# Patient Record
Sex: Female | Born: 1965 | Race: White | Hispanic: No | Marital: Married | Smoking: Never smoker
Health system: Southern US, Community
[De-identification: ages and names within clinical notes are randomized; demographics above are authoritative.]

## PROBLEM LIST (undated history)

## (undated) DIAGNOSIS — Z973 Presence of spectacles and contact lenses: Secondary | ICD-10-CM

## (undated) DIAGNOSIS — F32A Depression, unspecified: Secondary | ICD-10-CM

## (undated) DIAGNOSIS — F329 Major depressive disorder, single episode, unspecified: Secondary | ICD-10-CM

## (undated) DIAGNOSIS — E039 Hypothyroidism, unspecified: Secondary | ICD-10-CM

## (undated) HISTORY — PX: CHOLECYSTECTOMY: SHX55

## (undated) HISTORY — PX: GALLBLADDER SURGERY: SHX652

## (undated) HISTORY — PX: WISDOM TOOTH EXTRACTION: SHX21

---

## 1997-11-30 ENCOUNTER — Other Ambulatory Visit: Admission: RE | Admit: 1997-11-30 | Discharge: 1997-11-30 | Payer: Self-pay | Admitting: Obstetrics and Gynecology

## 1998-04-05 ENCOUNTER — Ambulatory Visit (HOSPITAL_COMMUNITY): Admission: RE | Admit: 1998-04-05 | Discharge: 1998-04-05 | Payer: Self-pay | Admitting: Obstetrics and Gynecology

## 1998-04-22 ENCOUNTER — Encounter: Admission: RE | Admit: 1998-04-22 | Discharge: 1998-07-21 | Payer: Self-pay | Admitting: Obstetrics and Gynecology

## 1998-06-28 ENCOUNTER — Inpatient Hospital Stay (HOSPITAL_COMMUNITY): Admission: AD | Admit: 1998-06-28 | Discharge: 1998-07-01 | Payer: Self-pay | Admitting: *Deleted

## 1998-07-02 ENCOUNTER — Encounter (HOSPITAL_COMMUNITY): Admission: RE | Admit: 1998-07-02 | Discharge: 1998-09-30 | Payer: Self-pay | Admitting: Obstetrics and Gynecology

## 1998-07-18 ENCOUNTER — Encounter: Payer: Self-pay | Admitting: Emergency Medicine

## 1998-07-18 ENCOUNTER — Inpatient Hospital Stay (HOSPITAL_COMMUNITY): Admission: EM | Admit: 1998-07-18 | Discharge: 1998-07-20 | Payer: Self-pay | Admitting: Emergency Medicine

## 1998-07-18 ENCOUNTER — Encounter: Payer: Self-pay | Admitting: Surgery

## 1998-10-04 ENCOUNTER — Encounter (HOSPITAL_COMMUNITY): Admission: RE | Admit: 1998-10-04 | Discharge: 1999-01-02 | Payer: Self-pay | Admitting: Obstetrics and Gynecology

## 2001-01-01 ENCOUNTER — Other Ambulatory Visit: Admission: RE | Admit: 2001-01-01 | Discharge: 2001-01-01 | Payer: Self-pay | Admitting: Obstetrics and Gynecology

## 2001-03-19 ENCOUNTER — Inpatient Hospital Stay (HOSPITAL_COMMUNITY): Admission: AD | Admit: 2001-03-19 | Discharge: 2001-03-19 | Payer: Self-pay | Admitting: Obstetrics and Gynecology

## 2001-03-22 ENCOUNTER — Inpatient Hospital Stay (HOSPITAL_COMMUNITY): Admission: AD | Admit: 2001-03-22 | Discharge: 2001-03-22 | Payer: Self-pay | Admitting: Obstetrics and Gynecology

## 2001-07-31 ENCOUNTER — Encounter (INDEPENDENT_AMBULATORY_CARE_PROVIDER_SITE_OTHER): Payer: Self-pay

## 2001-07-31 ENCOUNTER — Inpatient Hospital Stay (HOSPITAL_COMMUNITY): Admission: AD | Admit: 2001-07-31 | Discharge: 2001-08-03 | Payer: Self-pay | Admitting: *Deleted

## 2001-08-12 ENCOUNTER — Encounter: Admission: RE | Admit: 2001-08-12 | Discharge: 2001-09-11 | Payer: Self-pay | Admitting: Obstetrics and Gynecology

## 2001-09-09 ENCOUNTER — Other Ambulatory Visit: Admission: RE | Admit: 2001-09-09 | Discharge: 2001-09-09 | Payer: Self-pay | Admitting: Obstetrics and Gynecology

## 2001-09-12 ENCOUNTER — Encounter: Admission: RE | Admit: 2001-09-12 | Discharge: 2001-10-12 | Payer: Self-pay | Admitting: Obstetrics and Gynecology

## 2001-11-12 ENCOUNTER — Encounter: Admission: RE | Admit: 2001-11-12 | Discharge: 2001-12-12 | Payer: Self-pay | Admitting: Obstetrics and Gynecology

## 2001-12-13 ENCOUNTER — Encounter: Admission: RE | Admit: 2001-12-13 | Discharge: 2002-01-12 | Payer: Self-pay | Admitting: Obstetrics and Gynecology

## 2002-02-12 ENCOUNTER — Encounter: Admission: RE | Admit: 2002-02-12 | Discharge: 2002-03-14 | Payer: Self-pay | Admitting: Obstetrics and Gynecology

## 2002-04-14 ENCOUNTER — Encounter: Admission: RE | Admit: 2002-04-14 | Discharge: 2002-05-14 | Payer: Self-pay | Admitting: Obstetrics and Gynecology

## 2002-05-15 ENCOUNTER — Encounter: Admission: RE | Admit: 2002-05-15 | Discharge: 2002-06-14 | Payer: Self-pay | Admitting: Obstetrics and Gynecology

## 2004-02-16 ENCOUNTER — Encounter: Admission: RE | Admit: 2004-02-16 | Discharge: 2004-02-16 | Payer: Self-pay | Admitting: Family Medicine

## 2004-03-03 ENCOUNTER — Ambulatory Visit (HOSPITAL_COMMUNITY): Admission: RE | Admit: 2004-03-03 | Discharge: 2004-03-03 | Payer: Self-pay | Admitting: Family Medicine

## 2004-11-21 ENCOUNTER — Encounter: Admission: RE | Admit: 2004-11-21 | Discharge: 2004-11-21 | Payer: Self-pay | Admitting: Family Medicine

## 2005-03-19 HISTORY — PX: BREAST EXCISIONAL BIOPSY: SUR124

## 2005-09-14 ENCOUNTER — Encounter: Admission: RE | Admit: 2005-09-14 | Discharge: 2005-09-14 | Payer: Self-pay | Admitting: Family Medicine

## 2005-10-11 ENCOUNTER — Encounter (INDEPENDENT_AMBULATORY_CARE_PROVIDER_SITE_OTHER): Payer: Self-pay | Admitting: Specialist

## 2005-10-11 ENCOUNTER — Ambulatory Visit (HOSPITAL_BASED_OUTPATIENT_CLINIC_OR_DEPARTMENT_OTHER): Admission: RE | Admit: 2005-10-11 | Discharge: 2005-10-11 | Payer: Self-pay | Admitting: Surgery

## 2005-12-24 ENCOUNTER — Encounter: Admission: RE | Admit: 2005-12-24 | Discharge: 2005-12-24 | Payer: Self-pay | Admitting: Family Medicine

## 2006-03-19 HISTORY — PX: ROUX-EN-Y PROCEDURE: SUR1287

## 2006-03-19 HISTORY — PX: UMBILICAL HERNIA REPAIR: SHX2598

## 2006-03-22 ENCOUNTER — Encounter: Admission: RE | Admit: 2006-03-22 | Discharge: 2006-06-20 | Payer: Self-pay | Admitting: *Deleted

## 2006-03-22 ENCOUNTER — Ambulatory Visit (HOSPITAL_COMMUNITY): Admission: RE | Admit: 2006-03-22 | Discharge: 2006-03-22 | Payer: Self-pay | Admitting: *Deleted

## 2006-07-02 ENCOUNTER — Inpatient Hospital Stay (HOSPITAL_COMMUNITY): Admission: RE | Admit: 2006-07-02 | Discharge: 2006-07-08 | Payer: Self-pay | Admitting: *Deleted

## 2006-07-03 ENCOUNTER — Encounter: Payer: Self-pay | Admitting: Vascular Surgery

## 2006-07-03 ENCOUNTER — Ambulatory Visit: Payer: Self-pay | Admitting: Vascular Surgery

## 2006-07-09 ENCOUNTER — Encounter: Admission: RE | Admit: 2006-07-09 | Discharge: 2006-10-07 | Payer: Self-pay | Admitting: *Deleted

## 2006-08-20 ENCOUNTER — Encounter: Admission: RE | Admit: 2006-08-20 | Discharge: 2006-08-20 | Payer: Self-pay | Admitting: Surgery

## 2006-09-17 ENCOUNTER — Encounter: Admission: RE | Admit: 2006-09-17 | Discharge: 2006-09-17 | Payer: Self-pay | Admitting: *Deleted

## 2007-01-01 ENCOUNTER — Encounter: Admission: RE | Admit: 2007-01-01 | Discharge: 2007-01-01 | Payer: Self-pay | Admitting: *Deleted

## 2008-08-25 ENCOUNTER — Encounter: Admission: RE | Admit: 2008-08-25 | Discharge: 2008-08-25 | Payer: Self-pay | Admitting: Family Medicine

## 2008-09-15 ENCOUNTER — Encounter: Admission: RE | Admit: 2008-09-15 | Discharge: 2008-09-15 | Payer: Self-pay | Admitting: Family Medicine

## 2009-04-14 IMAGING — CT CT ABDOMEN W/ CM
2 of 5 series · 17 of 46 positions shown, 19 images · IV contrast (omnipaque)
Comparison: None

ABDOMEN CT WITH CONTRAST

CLINICAL DATA: Status post gastric bypass on 07/02/2006. Nausea, vomiting
TECHNIQUE: Multidetector CT imaging of the abdomen and pelvis was performed
following the standard protocol during bolus administration of intravenous
contrast.

Contrast:  125 cc Omnipaque 300

[Series 2: abd_pel 5.0 b20f st · axial · 0.87mm/px · z∈[-506,-80]mm · 14 of 96 slices shown, 16 images]
[im 6/96  soft-tissue]
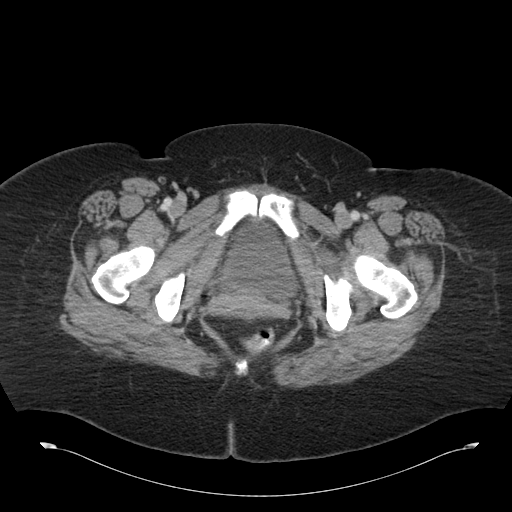
[im 6/96  bone]
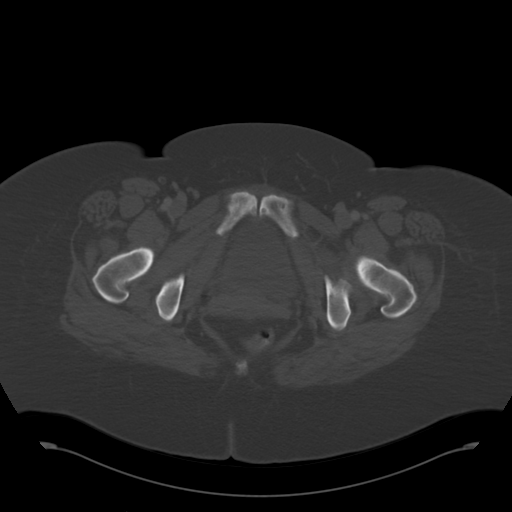
[im 11/96  soft-tissue]
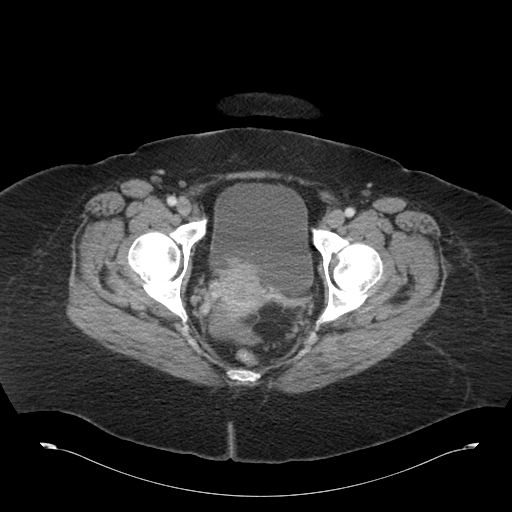
[im 21/96  soft-tissue]
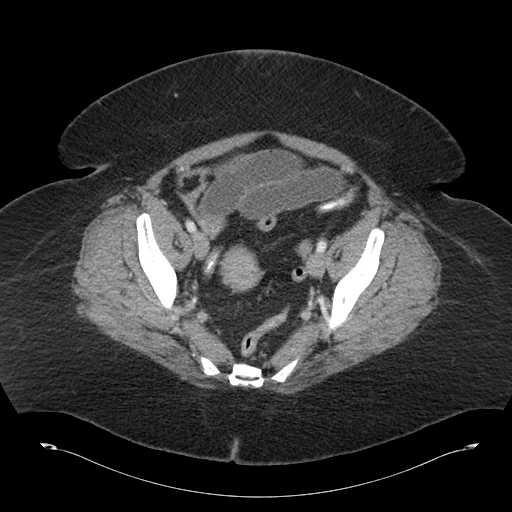
[im 26/96  soft-tissue]
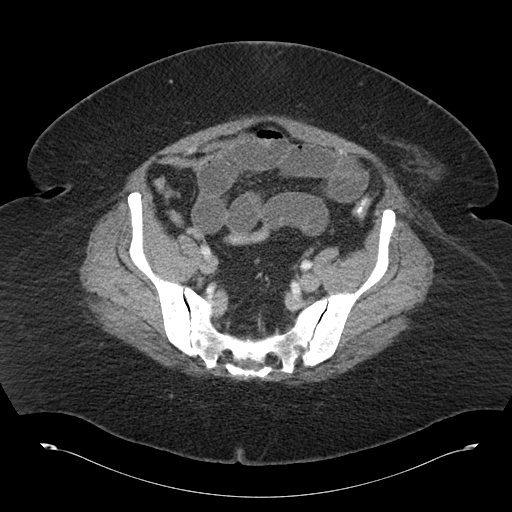
[im 31/96  soft-tissue]
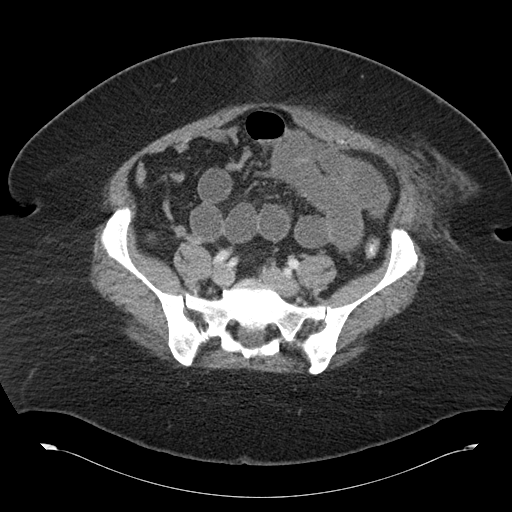
[im 41/96  soft-tissue]
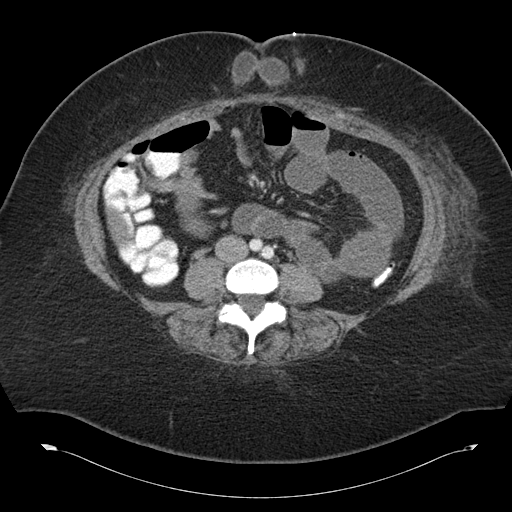
[im 46/96  soft-tissue]
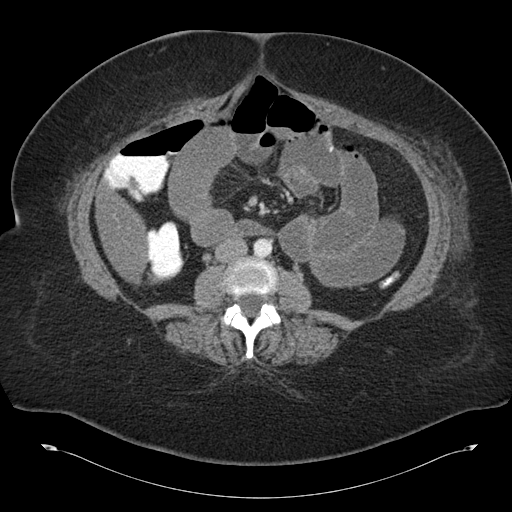
[im 51/96  soft-tissue]
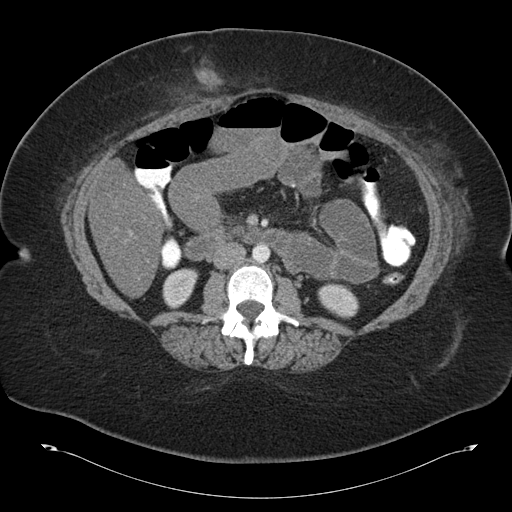
[im 56/96  soft-tissue]
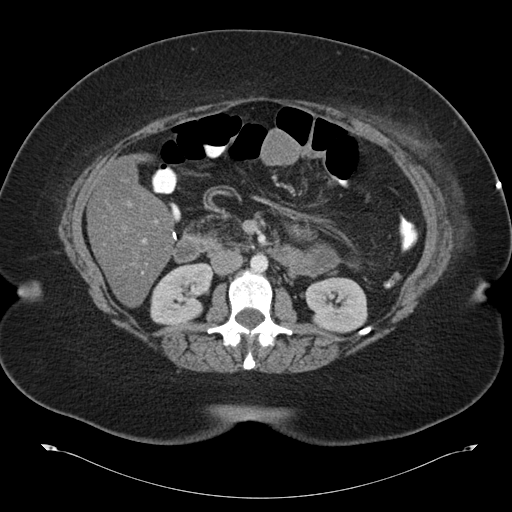
[im 56/96  bone]
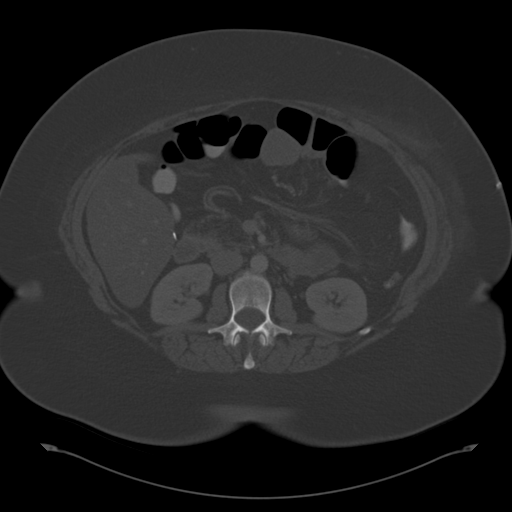
[im 66/96  soft-tissue]
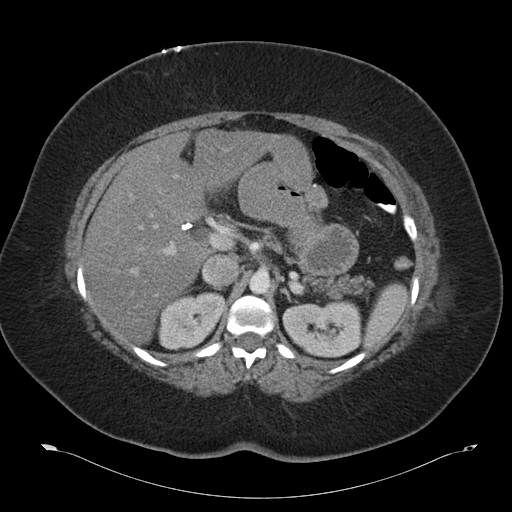
[im 71/96  soft-tissue]
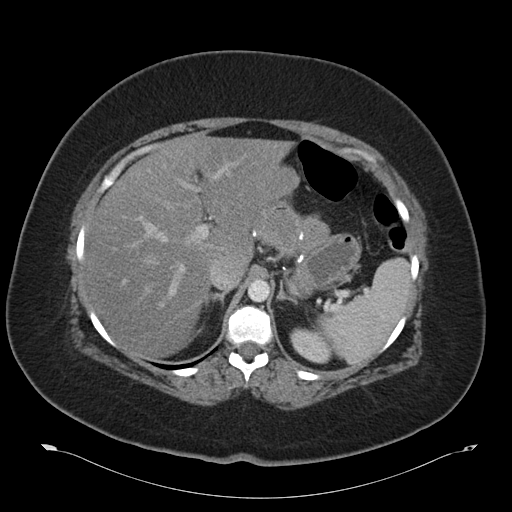
[im 76/96  soft-tissue]
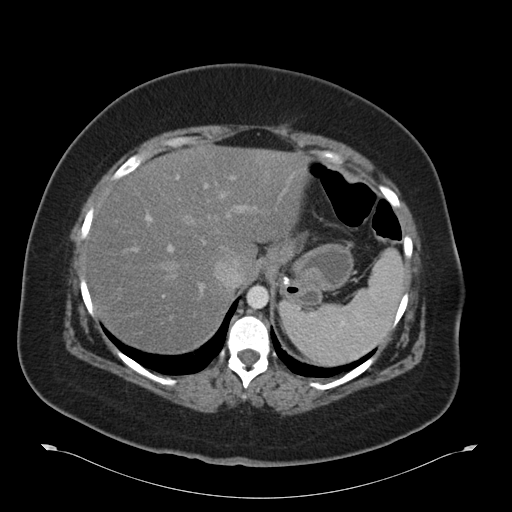
[im 86/96  soft-tissue]
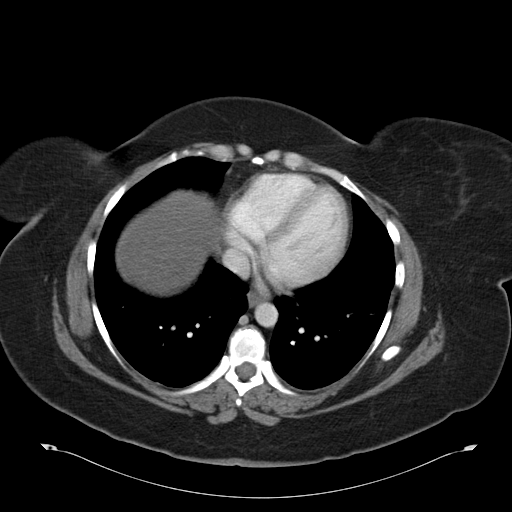
[im 91/96  soft-tissue]
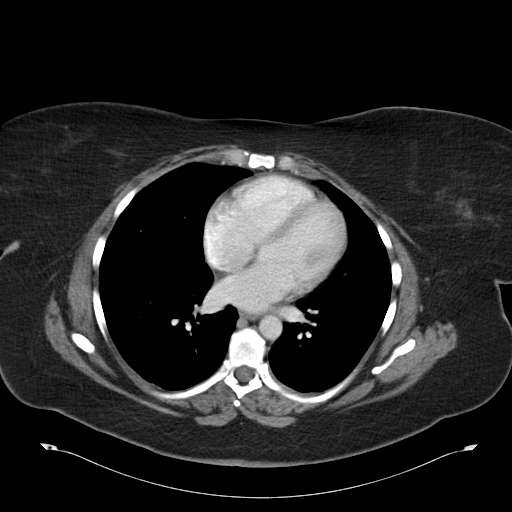

[Series 602: coronal abdomen · coronal · 0.98mm/px · 3 of 171 slices shown]
[im 57/171  soft-tissue]
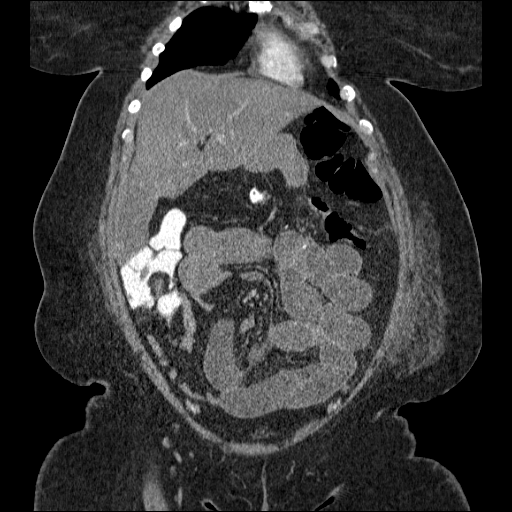
[im 76/171  soft-tissue]
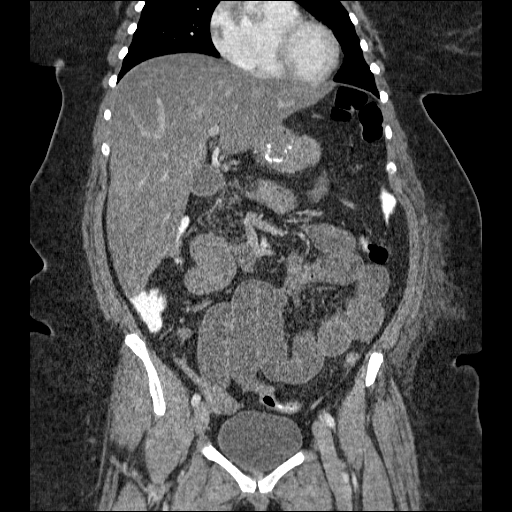
[im 95/171  soft-tissue]
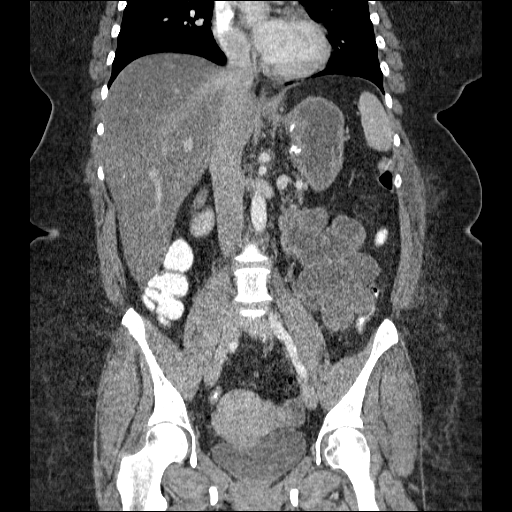

[17 of 46 positions shown; findings below may reference images not displayed]

FINDINGS: Patient status post gastric bypass surgery. There is an umbilical
hernia present which contains a loop of small bowel. Proximal to this, small
bowel is dilated. Distally, small bowel is decompressed. Findings are most
compatible with partial small bowel obstruction from an incarcerated umbilical
hernia. Contrast has passed into the colon.

There is fatty infiltration of the liver. The patient status post
cholecystectomy. Fatty replacement of the pancreas noted.

Adrenals, kidneys unremarkable.

There is linear atelectasis in the left lung base. No effusions.

IMPRESSION

Findings concerning for incarcerated umbilical hernia with partial small bowel
obstruction.

Status post gastric bypass.

Fatty liver.

PELVIS CT WITH CONTRAST
FINDINGS: Small bowel dilatation is noted proximally with distal decompression,
again compatible with partial small bowel obstruction from incarcerated
umbilical hernia. Uterus and adnexa unremarkable, with small cyst or follicle
within the left ovary. A small amount of free fluid is noted in the pelvis.

IMPRESSION

Findings compatible with partial small bowel obstruction as above. 

These results were called to Dr. Renzo Daniel at the time of interpretation.

## 2009-08-29 ENCOUNTER — Encounter: Admission: RE | Admit: 2009-08-29 | Discharge: 2009-08-29 | Payer: Self-pay | Admitting: Family Medicine

## 2010-04-10 ENCOUNTER — Encounter: Payer: Self-pay | Admitting: Family Medicine

## 2010-08-04 NOTE — H&P (Signed)
[facility]  Patient:    Amanda Koch, Amanda Koch Visit Number: [id number] MRN: [id number]          Service Type: OBS Location: 910B 9164 01 Attending Physician:  Donne Hazel Dictated by:   Willey Blade, M.D. Admit Date:  07/31/2001                           History and Physical  ADDENDUM TO REPORT #[id number]  PHYSICAL EXAMINATION:  VITAL SIGNS:                  Temperature afebrile.  Blood pressure 120/78, fetal heart tones 140s and reassuring.  GENERAL:                      She is a well-developed, well-nourished, overweight, and gravid female in no acute distress.  HEENT:                        Within normal limits.  NECK:                         Supple.  HEART:                        Regular rate and rhythm without murmur, gallop, or rub.  LUNGS:                        Clear to auscultation.  BREAST:                       Deferred.  ABDOMEN:                      Gravid and nontender.  EXTREMITIES AND NEUROLOGIC:   Grossly normal.  PELVIC:                       Normal external female genitalia noted.  Vagina clear but gravid.  Cervix is 2 cm, 40%, vertex, -2 station.  The uterus is gravid and nontender.  Adnexa is clear.  ADMITTING DIAGNOSIS:          1. Intrauterine pregnancy at term.                               2. Fetal macrosomia.                               3. Hypothyroidism.                               4. History of positive group B Streptococcus                                  vaginal colonization.  PLAN:                         1. Serial induction of labor.  2. Intravenous antibiotics for group B strep.                               3. Attempt vaginal delivery - discussed with                                  patient. Dictated by:   Willey Blade, M.D. Attending Physician:  Donne Hazel DD:  07/31/01 TD:  07/31/01 Job: [id number]

## 2010-08-04 NOTE — H&P (Signed)
[facility]  Patient:    Amanda Koch, Amanda Koch Visit Number: [id number] MRN: [id number]          Service Type: OBS Location: 910B 9164 01 Attending Physician:  Donne Hazel Dictated by:   Willey Blade, M.D. Admit Date:  07/31/2001                           History and Physical  HISTORY OF PRESENT ILLNESS:   Ms. Amanda Koch is a 45 year old female gravida 2 para 1 at 55 and four-sevenths weeks gestation.  The patient was admitted for induction of labor secondary to fetal macrosomia.  Her previous delivery, she had a baby weighing 8 pounds 6 ounces.  Her cervix is 2 cm dilated and ultrasound on Jul 30, 2001 shows a fetus weighing 9.8 pounds.  She was given the option of cesarean section versus induction of labor and has elected to undergo a trial of labor.  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  Her pregnancy otherwise went very well.  She underwent AFP extra testing which was normal.  OBSTETRICAL LABORATORY DATA:  Maternal blood type A positive.  Rubella immune. Group B strep positive.  Glucola elevated at 149; a subsequent three-hour glucose tolerance test was normal with values of 90, 177, 135, and 100.  MEDICAL HISTORY:              1. History of hypothyroidism.                               2. History of degenerative joint disease.                               3. History of anxiety.  SURGICAL HISTORY:             1. Normal spontaneous vaginal delivery x1 at                                  term.                               2. Wisdom teeth extraction.  OBSTETRICAL HISTORY:          In 2000, a normal spontaneous vaginal delivery at term, a female weighing 8 pounds 6 ounces.  CURRENT MEDICATIONS:          Synthroid and prenatal vitamins.  ALLERGIES:                    None known.  ADMITTING  DIAGNOSES:          1. Intrauterine pregnancy at term.                               2. Fetal macrosomia.                               3. History of positive group B Streptococcus  vaginal colonization.                               4. History of hypothyroidism.  PLAN:                         1. Admit.                               2. Serial induction of labor.                               3. Intravenous penicillin for positive group B                                  strep vaginal colonization.                               4. Attempt vaginal delivery - discussed with                                  patient. Dictated by:   Willey Blade, M.D. Attending Physician:  Donne Hazel DD:  07/31/01 TD:  07/31/01 Job: [id number]

## 2010-08-04 NOTE — Op Note (Signed)
Amanda Koch, Amanda Koch            ACCOUNT NO.:  [id number]   MEDICAL RECORD NO.:  [id number]          PATIENT TYPE:  INP   LOCATION:  1610                         FACILITY:  [facility]   PHYSICIAN:  Thornton Park. Daphine Deutscher, MD  DATE OF BIRTH:  10/27/1965   DATE OF PROCEDURE:  07/06/2006  DATE OF DISCHARGE:                               OPERATIVE REPORT   PREOPERATIVE DIAGNOSIS:  Incarcerated umbilical hernia.   POSTOPERATIVE DIAGNOSIS:  Incarcerated umbilical hernia.   PROCEDURE:  Takedown incarcerated umbilical hernia with primary closure  with Prolene suture.   SURGEON:  Thornton Park. Daphine Deutscher, MD.   ASSISTANT:  Alfonse Ras, MD.   ANESTHESIA:  General.   DESCRIPTION OF PROCEDURE:  [name] is on postop day #4  laparoscopic gastric bypass.  She has had some problems with persistent  nausea and vomiting.  A CT scan performed earlier in the day shows loops  of small bowel stuck up and an umbilical hernia.   The patient was informed and discussed in the holding area and a permit  was signed.  The patient was taken back room one and given general  anesthesia.  The abdomen was prepped with Techni-Care and draped  sterilely.  Small curvilinear incision was made and the umbilicus was  elevated off this incarcerated hernia that appeared to be about the size  of a golf ball.  I gently kind of dissected around this and went down  the neck.  I went superiorly where I could actually see a fascial defect  and inserted a Kelly and gently cut cephalad to create a big enough hole  to allow the bowel to be reduced easily.  I had cut into the sac and  looked at the bowel and it appeared to be a little bit congested but  there was no evidence of a small bowel infarction.  This was returned to  the abdomen.  Excess sac was cut away and then the fascial hole was  closed transversely with three sutures of 0 Prolene placed in a simple  fashion.  Once tied, the opening was obliterated in the fascia.   The  wound was then closed with 4-0 Vicryl subcutaneously with staples.  The  patient was taken to recovery room in satisfactory condition.      Thornton Park Daphine Deutscher, MD  Electronically Signed     MBM/MEDQ  D:  07/06/2006  T:  07/07/2006  Job:  [id number]

## 2010-08-04 NOTE — Op Note (Signed)
NAMELEANORA, MURIN            ACCOUNT NO.:  [id number]   MEDICAL RECORD NO.:  [id number]          PATIENT TYPE:  AMB   LOCATION:  NESC                         FACILITY:  [facility]   PHYSICIAN:  Wilmon Arms. Corliss Skains, M.D. DATE OF BIRTH:  [birth year]   DATE OF PROCEDURE:  10/11/2005  DATE OF DISCHARGE:                                 OPERATIVE REPORT   PREOPERATIVE DIAGNOSES:  Right nipple drainage.   POSTOPERATIVE DIAGNOSES:  Right nipple drainage.   PROCEDURES PERFORMED:  Right nipple duct excision.   SURGEON:  Wilmon Arms. Tsuei, M.D.   ANESTHESIA:  General via LMA.   INDICATIONS:  The patient is a 45 year old female, who presents with a two-  month history of persistent nipple drainage.  Several weeks ago, it was  bloody for several days.  She underwent a mammogram and ultrasound, which  were unremarkable.  A ductogram showed no evidence of filling defects.  The  patient was then referred for surgical evaluation.   DESCRIPTION OF PROCEDURE:  The patient was brought to the operating room and  placed in supine position on the operating room table.  After an adequate  level of general anesthesia was obtained, the patient's right breast was  prepped with Betadine and draped in sterile fashion.  Time-out was taken to  insure the patient and proper procedure.  I was able to express some clear  yellow discharge from the right nipple in the lateral half.  I was able to  cannulate this draining duct with a 00 lacrimal duct probe.  An incision was  made, after infiltrating with 0.25% Marcaine, approximately one inch lateral  to the nipple.  Dissection was carried into the subcutaneous tissues and  behind the nipple.  The duct in question was then amputated at the level of  the posterior surface of the nipple.  This area was grasped with an Allis  clamp.  I then took out a cone-shaped core of tissue, extending down from  the superficial apex.  The specimen measured approximately 2 inches in  height and 1.5 inch in diameter.  This was performed with electrocautery.  The specimen was passed off the field after being marked at the apex with a  2-0 silk suture.  The wound was then irrigated and hemostasis was obtained  with cautery.  The wound was closed with a deep layer of 3-0 Vicryl and a  subcuticular layer of 4-0 Monocryl.  Steri-Strips and clean dressings were  applied.  The patient was then extubated and brought to the recovery room in  stable condition.   Abdominal sponge, instrument and needle counts were correct.      Wilmon Arms. Tsuei, M.D.  Electronically Signed     MKT/MEDQ  D:  10/11/2005  T:  10/11/2005  Job:  [id number]

## 2010-08-04 NOTE — Op Note (Signed)
Amanda Koch, Amanda Koch            ACCOUNT NO.:  [id number]   MEDICAL RECORD NO.:  [id number]          PATIENT TYPE:  INP   LOCATION:  1610                         FACILITY:  [facility]   PHYSICIAN:  Alfonse Ras, MD   DATE OF BIRTH:  1965-10-21   DATE OF PROCEDURE:  07/02/2006  DATE OF DISCHARGE:                               OPERATIVE REPORT   PREOPERATIVE DIAGNOSIS:  Medically refractory morbid obesity.   POSTOPERATIVE DIAGNOSIS:  Medically refractory morbid obesity.   PROCEDURE:  Laparoscopic Roux-en-Y gastric bypass, anticolic,  antegastric, left facing Roux limb and upper endoscopy.   SURGEON:  Baruch Merl, M.D.   ASSISTANT:  Thornton Park. Daphine Deutscher, MD   ANESTHESIA:  General.   BLOOD LOSS:  200 mL.   DRAINS:  No drains left.   DESCRIPTION:  The patient was taken to the operating room, placed in  supine position.  After adequate anesthesia was induced using  endotracheal tube, the abdomen was prepped and draped in normal sterile  fashion.  Using a 12 mm OptiVu in the left upper quadrant direct  peritoneal access was obtained under direct vision.  Pneumoperitoneum  was obtained.  Two additional 12 mm trocars were placed in the right  upper quadrant, another one in the periumbilical region.  Small  umbilical hernia with omentum was reduced.  Additional 5 mm trocar was  placed in the left abdomen.   Ligament of Treitz was identified and the small bowel was transected  using a 45-mm white load GIA stapling device 40 cm distal to the  ligament.  The Roux limb was tagged with a Penrose drain and counting  100 cm distal, the jejunojejunostomy was performed in the standard  fashion.  Enterotomies were made with a harmonic scalpel 45-mm white  load GIA stapling device was applied.  The defect was closed with a  running 2-0 Vicryl suture.  The mesenteric defect was closed with a  running 2-0 silk suture.  The anastomosis was inspected and reinforced  using Tisseel.   Then the  patient was placed in steep head-up position.  Nathanson's  liver retractor was placed through a 5 mm incision in the subxiphoid  region.  Left lateral segment of the liver was retracted anteriorly.  The angle of his was sharply and bluntly dissected into the lesser sac.  An area approximately 5 cm distal to the EG junction was identified  along the lesser curve.  On entering the lesser sac.  There was a bit of  bleeding where I needed to apply some clips.  This was easily  controlled.  After the lesser sac had been entered.  A 60 mm gold load  GIA stapling device was then fired across the stomach.  It did appear to  have some misfiring at the proximal end.  The remainder of the pouch was  created using two a additional serial loads of the blue load GIA  stapling device to the angle of his.  Complete gastric separation was  ensured and the Ewald tube was placed down into the pouch.  After  placing the Ewald tube down to the pouch,  it appeared that there was  some mucosal rent on the right side of the pouch and therefore this part  of the pouch where the stapler was fired was just simply transected  using an additional GIA stapling device.  On reinspection this was  intact.   The Roux limb was then identified and brought up next to the pouch in  the left facing position.  Prior to doing this the remnant staple line  was oversewn with a running locking 2-0 silk suture.  Adequate  hemostasis was assured.  The very proximal part of the pouch was  reinforced with Tisseel.  After the Roux limb was placed next to the  pouch.  It was sewed posteriorly with running 2-0 Vicryl suture  approximating the pouch and the small bowel.  Enterotomy and gastrotomy  were made with a Harmonic scalpel and a satisfactory anastomosis was  created using a blue load GIA 45 mm stapling device.  Adequate  hemostasis was ensured.  The defect was closed with running 2-0 Vicryl  suture.  The Ewald tube was then placed  down through the anastomosis and  anterior 2-0 Vicryl on an SH was used to approximate the anterior  serosa.  Peterson's defect was then closed with a running 2-0 silk  suture.  Adequate hemostasis was assured.  The upper abdomen was then  copiously irrigated and upper endoscopy was performed by Dr. Daphine Deutscher  which will be dictated in a separate note, which showed no evidence of  leak.  The upper quadrants were aspirated.  Adequate hemostasis was  assured.  Pneumoperitoneum was released after Tisseel then applied to  the GJ anastomosis.  Trocars were removed.  There was some bleeding from  the right lower incision which was controlled with an interrupted 2-0  nylon suture.  The remainder of incisions were closed with staples.  Sterile dressings were applied.  The patient tolerated procedure well  with PACU in good condition.      Alfonse Ras, MD  Electronically Signed     KRE/MEDQ  D:  07/02/2006  T:  07/02/2006  Job:  [id number]

## 2010-08-04 NOTE — Discharge Summary (Signed)
[name], [name]            ACCOUNT NO.:  [id number]   MEDICAL RECORD NO.:  [id number]          PATIENT TYPE:  INP   LOCATION:  1610                         [facility]ACILITY:  [facility]   PHYSICIAN:  Alfonse Ras, MD   DATE OF BIRTH:  Jul 17, 1965   DATE OF ADMISSION:  07/02/2006  DATE OF DISCHARGE:  07/08/2006                               DISCHARGE SUMMARY   ADMISSION DIAGNOSIS:  Medically refractive morbid obesity.   DISCHARGE DIAGNOSIS:  Medically refractive morbid obesity, incarcerated  umbilical hernia in the postop period.   PROCEDURES:  Laparoscopic Roux-en-Y gastric bypass and open repair of  umbilical hernia toward a postop period.   CONDITION ON DISCHARGE:  Good and improved.   DISPOSITION:  Discharge to home.   MEDICATIONS:  Roxicet elixir for pain.  Synthroid and Zoloft as taken  preoperatively.   HOSPITAL COURSE:  The patient was admitted and underwent laparoscopic  Roux-en-Y gastric bypass.  At the time of surgery, the patient had an  incarcerated umbilical hernia with omentum through a small defect which  was reduced.  Gastric bypass went by uneventfully; however, over the  next 2-3 days the patient continued to have increasing nausea even after  a normal upper GI.  CT scan was obtained on postoperative day #4 which  showed an incarcerated umbilical hernia with small bowel incarcerated.  She was taken to the operating room to have reduction of this.  There  was no evidence of infarction of the bowel. This will be dictated  separately by Dr. Daphine Deutscher.  By postoperative day #1 and then #2, the  patient was tolerating a liquid diet, had no further nausea, incisions  looked fine and she was ready for discharge home.  I will follow up with  her in one week.      Alfonse Ras, MD  Electronically Signed     KRE/MEDQ  D:  07/08/2006  T:  07/08/2006  Job:  [id number]

## 2012-05-08 ENCOUNTER — Other Ambulatory Visit: Payer: Self-pay | Admitting: Orthopedic Surgery

## 2012-05-08 ENCOUNTER — Encounter (HOSPITAL_BASED_OUTPATIENT_CLINIC_OR_DEPARTMENT_OTHER): Payer: Self-pay | Admitting: *Deleted

## 2012-05-08 NOTE — Progress Notes (Signed)
No heart or resp problems-denies sleep apnea

## 2012-05-12 ENCOUNTER — Encounter (HOSPITAL_BASED_OUTPATIENT_CLINIC_OR_DEPARTMENT_OTHER): Payer: Self-pay | Admitting: Anesthesiology

## 2012-05-12 ENCOUNTER — Ambulatory Visit (HOSPITAL_BASED_OUTPATIENT_CLINIC_OR_DEPARTMENT_OTHER): Payer: 59 | Admitting: Anesthesiology

## 2012-05-12 ENCOUNTER — Ambulatory Visit (HOSPITAL_BASED_OUTPATIENT_CLINIC_OR_DEPARTMENT_OTHER)
Admission: RE | Admit: 2012-05-12 | Discharge: 2012-05-12 | Disposition: A | Discharge status: Home / Self Care | Payer: 59 | Source: Ambulatory Visit | Attending: Orthopedic Surgery | Admitting: Orthopedic Surgery

## 2012-05-12 ENCOUNTER — Encounter (HOSPITAL_BASED_OUTPATIENT_CLINIC_OR_DEPARTMENT_OTHER)
Admission: RE | Disposition: A | Discharge status: Home / Self Care | Payer: Self-pay | Source: Ambulatory Visit | Attending: Orthopedic Surgery

## 2012-05-12 DIAGNOSIS — E039 Hypothyroidism, unspecified: Secondary | ICD-10-CM | POA: Insufficient documentation

## 2012-05-12 DIAGNOSIS — F329 Major depressive disorder, single episode, unspecified: Secondary | ICD-10-CM | POA: Insufficient documentation

## 2012-05-12 DIAGNOSIS — F3289 Other specified depressive episodes: Secondary | ICD-10-CM | POA: Insufficient documentation

## 2012-05-12 DIAGNOSIS — Z79899 Other long term (current) drug therapy: Secondary | ICD-10-CM | POA: Insufficient documentation

## 2012-05-12 DIAGNOSIS — B079 Viral wart, unspecified: Secondary | ICD-10-CM | POA: Insufficient documentation

## 2012-05-12 HISTORY — DX: Depression, unspecified: F32.A

## 2012-05-12 HISTORY — DX: Major depressive disorder, single episode, unspecified: F32.9

## 2012-05-12 HISTORY — PX: I&D EXTREMITY: SHX5045

## 2012-05-12 HISTORY — DX: Hypothyroidism, unspecified: E03.9

## 2012-05-12 HISTORY — DX: Presence of spectacles and contact lenses: Z97.3

## 2012-05-12 SURGERY — IRRIGATION AND DEBRIDEMENT EXTREMITY
Anesthesia: Monitor Anesthesia Care | Site: Finger | Laterality: Left | Wound class: Clean

## 2012-05-12 MED ORDER — BUPIVACAINE HCL (PF) 0.25 % IJ SOLN
INTRAMUSCULAR | Status: DC | PRN
  Administered 2012-05-12: 10 mL

## 2012-05-12 MED ORDER — HYDROCODONE-ACETAMINOPHEN 5-325 MG PO TABS: ORAL_TABLET | ORAL | Status: DC

## 2012-05-12 MED ORDER — LIDOCAINE HCL (PF) 0.5 % IJ SOLN
INTRAMUSCULAR | Status: DC | PRN
  Administered 2012-05-12: 35 mL via INTRAVENOUS

## 2012-05-12 MED ORDER — MIDAZOLAM HCL 5 MG/5ML IJ SOLN
INTRAMUSCULAR | Status: DC | PRN
  Administered 2012-05-12: 2 mg via INTRAVENOUS

## 2012-05-12 MED ORDER — DEXAMETHASONE SODIUM PHOSPHATE 10 MG/ML IJ SOLN
INTRAMUSCULAR | Status: DC | PRN
  Administered 2012-05-12: 10 mg via INTRAVENOUS

## 2012-05-12 MED ORDER — KETOROLAC TROMETHAMINE 30 MG/ML IJ SOLN
INTRAMUSCULAR | Status: DC | PRN
  Administered 2012-05-12: 30 mg via INTRAVENOUS

## 2012-05-12 MED ORDER — LACTATED RINGERS IV SOLN
INTRAVENOUS | Status: DC
  Administered 2012-05-12: 11:00:00 via INTRAVENOUS

## 2012-05-12 MED ORDER — CHLORHEXIDINE GLUCONATE 4 % EX LIQD: 60.0000 mL | Freq: Once | CUTANEOUS | Status: DC

## 2012-05-12 MED ORDER — FENTANYL CITRATE 0.05 MG/ML IJ SOLN
50.0000 ug | INTRAMUSCULAR | Status: DC | PRN
Start: 2012-05-12 — End: 2012-05-12

## 2012-05-12 MED ORDER — LIDOCAINE HCL (CARDIAC) 20 MG/ML IV SOLN
INTRAVENOUS | Status: DC | PRN
  Administered 2012-05-12: 50 mg via INTRAVENOUS

## 2012-05-12 MED ORDER — MIDAZOLAM HCL 2 MG/2ML IJ SOLN: 1.0000 mg | INTRAMUSCULAR | Status: DC | PRN

## 2012-05-12 MED ORDER — PROPOFOL 10 MG/ML IV EMUL
INTRAVENOUS | Status: DC | PRN
  Administered 2012-05-12: 100 ug/kg/min via INTRAVENOUS

## 2012-05-12 MED ORDER — FENTANYL CITRATE 0.05 MG/ML IJ SOLN
INTRAMUSCULAR | Status: DC | PRN
  Administered 2012-05-12: 50 ug via INTRAVENOUS
  Administered 2012-05-12 (×2): 25 ug via INTRAVENOUS

## 2012-05-12 MED ORDER — CEFAZOLIN SODIUM-DEXTROSE 2-3 GM-% IV SOLR
2.0000 g | Freq: Once | INTRAVENOUS | Status: AC
  Administered 2012-05-12: 2 g via INTRAVENOUS

## 2012-05-12 SURGICAL SUPPLY — 48 items
BAG DECANTER FOR FLEXI CONT (MISCELLANEOUS) IMPLANT
BANDAGE ELASTIC 3 VELCRO ST LF (GAUZE/BANDAGES/DRESSINGS) IMPLANT
BANDAGE GAUZE ELAST BULKY 4 IN (GAUZE/BANDAGES/DRESSINGS) IMPLANT
BANDAGE GAUZE STRT 1 STR LF (GAUZE/BANDAGES/DRESSINGS) IMPLANT
BLADE MINI RND TIP GREEN BEAV (BLADE) IMPLANT
BLADE SURG 15 STRL LF DISP TIS (BLADE) ×2 IMPLANT
BLADE SURG 15 STRL SS (BLADE) ×2
BNDG COHESIVE 1X5 TAN STRL LF (GAUZE/BANDAGES/DRESSINGS) IMPLANT
BNDG ELASTIC 2 VLCR STRL LF (GAUZE/BANDAGES/DRESSINGS) IMPLANT
BNDG ESMARK 4X9 LF (GAUZE/BANDAGES/DRESSINGS) IMPLANT
CHLORAPREP W/TINT 26ML (MISCELLANEOUS) ×2 IMPLANT
CLOTH BEACON ORANGE TIMEOUT ST (SAFETY) ×2 IMPLANT
CORDS BIPOLAR (ELECTRODE) ×2 IMPLANT
COVER MAYO STAND STRL (DRAPES) ×2 IMPLANT
COVER TABLE BACK 60X90 (DRAPES) ×2 IMPLANT
CUFF TOURNIQUET SINGLE 18IN (TOURNIQUET CUFF) ×2 IMPLANT
DRAPE EXTREMITY T 121X128X90 (DRAPE) ×2 IMPLANT
DRAPE SURG 17X23 STRL (DRAPES) ×2 IMPLANT
GAUZE PACKING IODOFORM 1/4X5 (PACKING) IMPLANT
GAUZE XEROFORM 1X8 LF (GAUZE/BANDAGES/DRESSINGS) ×2 IMPLANT
GLOVE BIO SURGEON STRL SZ 6.5 (GLOVE) ×2 IMPLANT
GLOVE BIO SURGEON STRL SZ7.5 (GLOVE) ×2 IMPLANT
GLOVE BIOGEL PI IND STRL 8 (GLOVE) ×1 IMPLANT
GLOVE BIOGEL PI INDICATOR 8 (GLOVE) ×1
GOWN PREVENTION PLUS XLARGE (GOWN DISPOSABLE) ×4 IMPLANT
GOWN STRL REIN XL XLG (GOWN DISPOSABLE) ×2 IMPLANT
LOOP VESSEL MAXI BLUE (MISCELLANEOUS) IMPLANT
NEEDLE HYPO 25X1 1.5 SAFETY (NEEDLE) ×2 IMPLANT
NS IRRIG 1000ML POUR BTL (IV SOLUTION) ×2 IMPLANT
PACK BASIN DAY SURGERY FS (CUSTOM PROCEDURE TRAY) ×2 IMPLANT
PAD CAST 3X4 CTTN HI CHSV (CAST SUPPLIES) IMPLANT
PADDING CAST ABS 4INX4YD NS (CAST SUPPLIES) ×1
PADDING CAST ABS COTTON 4X4 ST (CAST SUPPLIES) ×1 IMPLANT
PADDING CAST COTTON 3X4 STRL (CAST SUPPLIES)
SPLINT PLASTER CAST XFAST 3X15 (CAST SUPPLIES) IMPLANT
SPLINT PLASTER XTRA FASTSET 3X (CAST SUPPLIES)
SPONGE GAUZE 4X4 12PLY (GAUZE/BANDAGES/DRESSINGS) ×2 IMPLANT
STOCKINETTE 4X48 STRL (DRAPES) ×2 IMPLANT
SUT ETHILON 4 0 PS 2 18 (SUTURE) ×2 IMPLANT
SUT ETHILON 5 0 PS 2 18 (SUTURE) ×2 IMPLANT
SWAB COLLECTION DEVICE MRSA (MISCELLANEOUS) IMPLANT
SYR BULB 3OZ (MISCELLANEOUS) ×2 IMPLANT
SYR CONTROL 10ML LL (SYRINGE) ×2 IMPLANT
TOWEL OR 17X24 6PK STRL BLUE (TOWEL DISPOSABLE) ×2 IMPLANT
TUBE ANAEROBIC SPECIMEN COL (MISCELLANEOUS) IMPLANT
TUBE FEEDING 5FR 15 INCH (TUBING) IMPLANT
UNDERPAD 30X30 INCONTINENT (UNDERPADS AND DIAPERS) ×2 IMPLANT
WATER STERILE IRR 1000ML POUR (IV SOLUTION) IMPLANT

## 2012-05-12 NOTE — Brief Op Note (Signed)
05/12/2012  1:14 PM  PATIENT:  Amanda Koch  47 y.o. female  PRE-OPERATIVE DIAGNOSIS:  LEFT INDEX FINGER FOREIGN BODY  POST-OPERATIVE DIAGNOSIS:  LEFT INDEX FINGER FOREIGN BODY  PROCEDURE:  Procedure(s): LEFT INDEX FINGER IRRIGATION AND DEBRIDEMENT AND REMOVAL FOREIGN BODY  (Left)  SURGEON:  Surgeon(s) and Role:    * Tami Ribas, MD - Primary  PHYSICIAN ASSISTANT:   ASSISTANTS: none   ANESTHESIA:   Bier block  EBL:  Total I/O In: 600 [I.V.:600] Out: -   BLOOD ADMINISTERED:none  DRAINS: none   LOCAL MEDICATIONS USED:  MARCAINE     SPECIMEN:  Source of Specimen:  left index finger  DISPOSITION OF SPECIMEN:  PATHOLOGY  COUNTS:  YES  TOURNIQUET:   Total Tourniquet Time Documented: Forearm (Right) - 29 minutes Total: Forearm (Right) - 29 minutes   DICTATION: .Other Dictation: Dictation Number [id number]  PLAN OF CARE: Discharge to home after PACU  PATIENT DISPOSITION:  PACU - hemodynamically stable.

## 2012-05-12 NOTE — Transfer of Care (Signed)
Immediate Anesthesia Transfer of Care Note  Patient: Amanda Koch  Procedure(s) Performed: Procedure(s): LEFT INDEX FINGER IRRIGATION AND DEBRIDEMENT AND REMOVAL FOREIGN BODY  (Left)  Patient Location: PACU  Anesthesia Type:MAC and Bier block  Level of Consciousness: awake and alert   Airway & Oxygen Therapy: Patient Spontanous Breathing and Patient connected to face mask oxygen  Post-op Assessment: Report given to PACU RN and Post -op Vital signs reviewed and stable  Post vital signs: Reviewed and stable  Complications: No apparent anesthesia complications

## 2012-05-12 NOTE — Anesthesia Preprocedure Evaluation (Signed)
Anesthesia Evaluation  Patient identified by MRN, date of birth, ID band Patient awake    Reviewed: Allergy & Precautions, H&P , NPO status , Patient's Chart, lab work & pertinent test results, reviewed documented beta blocker date and time   Airway Mallampati: II TM Distance: >3 FB Neck ROM: full    Dental   Pulmonary neg pulmonary ROS,  breath sounds clear to auscultation        Cardiovascular negative cardio ROS  Rhythm:regular     Neuro/Psych negative neurological ROS  negative psych ROS   GI/Hepatic negative GI ROS, Neg liver ROS,   Endo/Other  Hypothyroidism Morbid obesity  Renal/GU negative Renal ROS  negative genitourinary   Musculoskeletal   Abdominal   Peds  Hematology negative hematology ROS (+)   Anesthesia Other Findings See surgeon's H&P   Reproductive/Obstetrics negative OB ROS                           Anesthesia Physical Anesthesia Plan  ASA: III  Anesthesia Plan: MAC and Bier Block   Post-op Pain Management:    Induction:   Airway Management Planned: Simple Face Mask  Additional Equipment:   Intra-op Plan:   Post-operative Plan: Extubation in OR  Informed Consent: I have reviewed the patients History and Physical, chart, labs and discussed the procedure including the risks, benefits and alternatives for the proposed anesthesia with the patient or authorized representative who has indicated his/her understanding and acceptance.   Dental Advisory Given  Plan Discussed with: CRNA and Surgeon  Anesthesia Plan Comments:         Anesthesia Quick Evaluation

## 2012-05-12 NOTE — H&P (Signed)
  Amanda Koch is an 47 y.o. female.   Chief Complaint: left index foreign body HPI: 47 yo rhd female thinks she cut left index on paper ~ 3 weeks ago.  Tried to remove piece of foreign body 2 weeks ago.  Now with skin change and feeling of foreign body in finger.  Started on doxycycline 1 week ago.  No fevers, chills, night sweats.  Past Medical History  Diagnosis Date  . Wears glasses   . Hypothyroidism   . Depression     Past Surgical History  Procedure Laterality Date  . Roux-en-y procedure  2008  . Umbilical hernia repair  2008  . Breast excisional biopsy  2007    rt breast nipple exploration for d/c  . Wisdom tooth extraction    . Cholecystectomy  2000    History reviewed. No pertinent family history. Social History:  reports that she has never smoked. She does not have any smokeless tobacco history on file. She reports that  drinks alcohol. She reports that she does not use illicit drugs.  Allergies: No Known Allergies  Medications Prior to Admission  Medication Sig Dispense Refill  . cholecalciferol (VITAMIN D) 1000 UNITS tablet Take 1,000 Units by mouth daily.      Marland Kitchen doxycycline (ADOXA) 100 MG tablet Take 100 mg by mouth 2 (two) times daily.      Marland Kitchen levothyroxine (SYNTHROID, LEVOTHROID) 88 MCG tablet Take 88 mcg by mouth daily.      . Multiple Vitamins-Minerals (MULTIVITAMIN WITH MINERALS) tablet Take 1 tablet by mouth daily.      . sertraline (ZOLOFT) 50 MG tablet Take 50 mg by mouth daily. Takes 1 1/2 =75mg       . vitamin B-12 (CYANOCOBALAMIN) 1000 MCG tablet Take 1,000 mcg by mouth daily.        No results found for this or any previous visit (from the past 48 hour(s)).  No results found.   A comprehensive review of systems was negative except for: Eyes: positive for contacts/glasses Behavioral/Psych: positive for depression  Blood pressure 145/76, pulse 71, temperature 98.4 F (36.9 C), temperature source Oral, resp. rate 20, height 5\' 6"  (1.676 m),  weight 112.095 kg (247 lb 2 oz), last menstrual period 05/07/2012, SpO2 96.00%.  General appearance: alert, cooperative and appears stated age Head: Normocephalic, without obvious abnormality, atraumatic Neck: supple, symmetrical, trachea midline Resp: clear to auscultation bilaterally Cardio: regular rate and rhythm GI: non tender Extremities: intact sensatin and capillary refill all digits.  +epl/fpl/io.  left index with heaped area volar middle phalanx.  Pulses: 2+ and symmetric Skin: heaped up area of skin on left index with surrounding irritation. Neurologic: Grossly normal Incision/Wound: na  Assessment/Plan Left index possible foreign body.  Non operative and operative treatment options were discussed with the patient and patient wishes to proceed with operative treatment. Risks, benefits, and alternatives of surgery were discussed and the patient agrees with the plan of care.   Amanda Koch R 05/12/2012, 11:16 AM

## 2012-05-12 NOTE — Op Note (Signed)
[id number] 

## 2012-05-12 NOTE — Anesthesia Postprocedure Evaluation (Signed)
Anesthesia Post Note  Patient: Amanda Koch  Procedure(s) Performed: Procedure(s) (LRB): LEFT INDEX FINGER IRRIGATION AND DEBRIDEMENT AND REMOVAL FOREIGN BODY  (Left)  Anesthesia type: MAC  Patient location: PACU  Post pain: Pain level controlled  Post assessment: Patient's Cardiovascular Status Stable  Last Vitals:  Filed Vitals:   05/12/12 1334  BP:   Pulse: 59  Temp:   Resp: 15    Post vital signs: Reviewed and stable  Level of consciousness: alert  Complications: No apparent anesthesia complications

## 2012-05-12 NOTE — Anesthesia Procedure Notes (Signed)
Procedure Name: MAC Performed by: Purvis Sidle W Pre-anesthesia Checklist: Patient identified, Timeout performed, Emergency Drugs available, Suction available and Patient being monitored Patient Re-evaluated:Patient Re-evaluated prior to inductionOxygen Delivery Method: Simple face mask Placement Confirmation: positive ETCO2 Dental Injury: Teeth and Oropharynx as per pre-operative assessment      

## 2012-05-13 ENCOUNTER — Encounter (HOSPITAL_BASED_OUTPATIENT_CLINIC_OR_DEPARTMENT_OTHER): Payer: Self-pay | Admitting: Orthopedic Surgery

## 2012-05-13 NOTE — Op Note (Signed)
NAMEJennye Koch             ACCOUNT NO.:  [id number]  MEDICAL RECORD NO.:  [id number]  LOCATION:                                 FACILITY:  PHYSICIAN:  Betha Loa, MD             DATE OF BIRTH:  DATE OF PROCEDURE:  05/12/2012 DATE OF DISCHARGE:                              OPERATIVE REPORT   PREOPERATIVE DIAGNOSIS:  Left index finger mass.  POSTOPERATIVE DIAGNOSIS:  Left index finger mass.  PROCEDURE:  Excision of mass, left index finger.  SURGEON:  Betha Loa, MD  ASSISTANT:  None.  ANESTHESIA:  Bier block with sedation.  IV FLUIDS:  Per anesthesia flow sheet.  ESTIMATED BLOOD LOSS:  Minimal.  COMPLICATIONS:  None.  SPECIMENS:  Left index finger mass to pathology.  TOURNIQUET TIME:  29 minutes.  DISPOSITION:  Stable to PACU.  INDICATIONS:  Ms. Amanda Koch is a 47 year old female who states she lacerated her left index finger approximately 3 weeks ago.  She tried to take a piece of what she felt was foreign matter out approximately 2 weeks ago.  She followed up in the main office last week.  She had a heaped up lesion on the volar aspect of index finger over the middle phalanx. There was some rotational erythema surrounding this.  No proximal erythema.  She was tender directly on top of the mass.  She was not tender elsewhere.  She would like it removed.  Risks, benefits, and alternatives of surgery were discussed including risk of blood loss; infection; damage to nerves, vessels, tendons, ligaments, bone; failure of surgery; need for additional surgery; complications with healing and recurrence of mass.  She voiced understanding of these risks and elected to proceed.  OPERATIVE COURSE:  After being identified preoperatively by myself, the patient and I agreed upon procedure and site procedure.  Surgical site was marked.  Risks, benefits, and alternatives of surgery were reviewed. She wished to proceed.  Surgical consent had been signed.  She was taken to  the operating room and placed on operating table in supine position with left upper extremity on arm board.  Bier block anesthesia was induced by the anesthesiologist.  The left upper extremity was prepped and draped in normal sterile orthopedic fashion.  Surgical pause was performed between surgeons, anesthesia, operating staff, and all were in agreement as to the patient, procedure, and site of procedure. Tourniquet at the proximal aspect of the forearm had been inflated for the Bier block.  An incision was made, ellipsing out the mass on the volar aspect of the finger.  The mass was in the skin itself. The skin was thickened.  I did not see any significant changes in the subcutaneous tissue with the exception of a small area where the subcutaneous tissue was thickened as well.  This was removed as well. The mass was sent to Pathology for examination.  I did not see any foreign body.  There was no purulence.  The wound was copiously irrigated with sterile saline.  It was closed with 5-0 nylon in interrupted fashion.  The wound was dressed with sterile Xeroform, 4 x 4, and wrapped with a Coban dressing  lightly.  A digital block had been performed with 10 mL of 0.25% plain Marcaine to aid in postoperative analgesia.  The operative drapes were broken down.  The tourniquet was deflated at 29 minutes.  Fingertips were pink with brisk capillary refill after deflation of tourniquet.  Operative drapes were broken down.  The patient was awoken from anesthesia safely.  She was transferred back to stretcher and taken to PACU in stable condition.  I will see her back in a week for postoperative followup.  She was given Norco 5/325 one to two p.o. q.6 h. p.r.n. pain, dispensed #30.     Betha Loa, MD     KK/MEDQ  D:  05/12/2012  T:  05/13/2012  Job:  [id number]

## 2013-01-22 ENCOUNTER — Other Ambulatory Visit: Payer: Self-pay

## 2013-09-03 ENCOUNTER — Other Ambulatory Visit: Payer: Self-pay | Admitting: Obstetrics and Gynecology

## 2013-10-12 ENCOUNTER — Encounter (HOSPITAL_COMMUNITY): Payer: Self-pay | Admitting: Pharmacist

## 2013-10-22 ENCOUNTER — Encounter (HOSPITAL_COMMUNITY): Payer: Self-pay

## 2013-10-22 ENCOUNTER — Encounter (HOSPITAL_COMMUNITY)
Admission: RE | Admit: 2013-10-22 | Discharge: 2013-10-22 | Disposition: A | Discharge status: Home / Self Care | Payer: 59 | Source: Ambulatory Visit | Attending: Obstetrics and Gynecology | Admitting: Obstetrics and Gynecology

## 2013-10-22 DIAGNOSIS — Z01818 Encounter for other preprocedural examination: Secondary | ICD-10-CM | POA: Insufficient documentation

## 2013-10-22 DIAGNOSIS — Z01812 Encounter for preprocedural laboratory examination: Secondary | ICD-10-CM | POA: Diagnosis present

## 2013-10-22 LAB — COMPREHENSIVE METABOLIC PANEL
ALT: 14 U/L (ref 0–35)
AST: 15 U/L (ref 0–37)
Albumin: 3.8 g/dL (ref 3.5–5.2)
Alkaline Phosphatase: 70 U/L (ref 39–117)
Anion gap: 13 (ref 5–15)
BILIRUBIN TOTAL: 0.4 mg/dL (ref 0.3–1.2)
BUN: 11 mg/dL (ref 6–23)
CHLORIDE: 103 meq/L (ref 96–112)
CO2: 25 meq/L (ref 19–32)
CREATININE: 0.67 mg/dL (ref 0.50–1.10)
Calcium: 9.4 mg/dL (ref 8.4–10.5)
GFR calc Af Amer: >90 mL/min (ref >90)
Glucose, Bld: 93 mg/dL (ref 70–99)
Potassium: 4.4 mEq/L (ref 3.7–5.3)
SODIUM: 141 meq/L (ref 137–147)
Total Protein: 7.3 g/dL (ref 6.0–8.3)

## 2013-10-22 LAB — CBC
HCT: 40.7 % (ref 36.0–46.0)
Hemoglobin: 13.9 g/dL (ref 12.0–15.0)
MCH: 30.6 pg (ref 26.0–34.0)
MCHC: 34.2 g/dL (ref 30.0–36.0)
MCV: 89.6 fL (ref 78.0–100.0)
PLATELETS: 300 10*3/uL (ref 150–400)
RBC: 4.54 MIL/uL (ref 3.87–5.11)
RDW: 13.1 % (ref 11.5–15.5)
WBC: 6.7 10*3/uL (ref 4.0–10.5)

## 2013-10-22 NOTE — Patient Instructions (Signed)
[place]  10/22/2013   Your procedure is scheduled on:  10/27/13  Enter through the Main Entrance of [facility] [place] up the phone at the desk and dial [phone].   Call this number if you have problems the morning of surgery: [phone]   Remember:   Do not eat food:After Midnight.  Do not drink clear liquids: After Midnight.  Take these medicines the morning of surgery with A SIP OF WATER: NA   Do not wear jewelry, make-up or nail polish.  Do not wear lotions, powders, or perfumes. You may wear deodorant.  Do not shave 48 hours prior to surgery.  Do not bring valuables to the hospital.  [facility] is not   responsible for any belongings or valuables brought to the hospital.  Contacts, dentures or bridgework may not be worn into surgery.  Leave suitcase in the car. After surgery it may be brought to your room.  For patients admitted to the hospital, checkout time is 11:00 AM the day of              discharge.   Patients discharged the day of surgery will not be allowed to drive             home.  Name and phone number of your driver: NA  Special Instructions:      Please read over the following fact sheets that you were given:   Surgical Site Infection Prevention

## 2013-10-26 NOTE — H&P (Signed)
Amanda Koch is an 48 y.o. female G2P2 with increasingly heavy and irregular menses. U/S C/W multiple fibroids of 8 to 18 mm size. A 13 mm polypoid mass also noted in EM cavity.                                                                                                                                                                                                                                                                                       Pertinent Gynecological History: Menses: flow is excessive with use of many pads or tampons on heaviest days Bleeding: dysfunctional uterine bleeding Contraception: abstinence DES exposure: denies Blood transfusions: none Sexually transmitted diseases: no past history Previous GYN Procedures: none  Last mammogram: abnormal: ASCUS, cannot R/O high grade, Cx biopsy C/W CIN I Date: 2015 Last pap: normal Date: 2015 OB History: G2, P2   Menstrual History: Menarche age: unknown  No LMP recorded.    Past Medical History  Diagnosis Date  . Wears glasses   . Hypothyroidism   . Depression     Past Surgical History  Procedure Laterality Date  . Roux-en-y procedure  2008  . Umbilical hernia repair  2008  . Breast excisional biopsy  2007    rt breast nipple exploration for d/c  . Wisdom tooth extraction    . Cholecystectomy  2000  . I&d extremity Left 05/12/2012    Procedure: LEFT INDEX FINGER IRRIGATION AND DEBRIDEMENT AND REMOVAL FOREIGN BODY ;  Surgeon: Tennis Must, MD;  Location: [place];  Service: Orthopedics;  Laterality: Left;    No family history on file.  Social History:  reports that she has never smoked. She does not have any smokeless tobacco history on file. She reports that she drinks alcohol. She reports that she does not use illicit drugs.  Allergies: No Known Allergies  No prescriptions prior to admission    Review of Systems  Constitutional: Negative for fever.    There were no vitals taken  for this visit. Physical Exam  Cardiovascular: Normal rate and regular rhythm.   Respiratory: Effort normal and breath sounds normal.  GI: Soft. There is  tenderness.  Genitourinary:  Uterus 6 weeks size and mobile Adnexa NT without masses  Neurological: She has normal reflexes.    No results found for this or any previous visit (from the past 24 hour(s)).  No results found.  Assessment/Plan: 48 yo with fibroids and EM polyp and menometorrhagia for LAVH/bilat salpingectomy, removal of one or both ovaries if abnormal Risks reviewed including infection, organ damage, bleeding/transfusion-HIV/Hep, DVT/PE, pneumonia, return to OR, laparotomy, pelvic/abdominal pain, pain with intercourse, fistula.  All questions answered.  Dawood Spitler II,Lamis Behrmann E 10/26/2013, 5:13 PM

## 2013-10-27 ENCOUNTER — Ambulatory Visit (HOSPITAL_COMMUNITY): Payer: 59 | Admitting: Anesthesiology

## 2013-10-27 ENCOUNTER — Encounter (HOSPITAL_COMMUNITY)
Admission: RE | Disposition: A | Discharge status: Home / Self Care | Payer: Self-pay | Source: Ambulatory Visit | Attending: Obstetrics and Gynecology

## 2013-10-27 ENCOUNTER — Encounter (HOSPITAL_COMMUNITY): Payer: 59 | Admitting: Anesthesiology

## 2013-10-27 ENCOUNTER — Observation Stay (HOSPITAL_COMMUNITY)
Admission: RE | Admit: 2013-10-27 | Discharge: 2013-10-28 | Disposition: A | Discharge status: Home / Self Care | Payer: 59 | Source: Ambulatory Visit | Attending: Obstetrics and Gynecology | Admitting: Obstetrics and Gynecology

## 2013-10-27 ENCOUNTER — Encounter (HOSPITAL_COMMUNITY): Payer: Self-pay | Admitting: *Deleted

## 2013-10-27 DIAGNOSIS — D219 Benign neoplasm of connective and other soft tissue, unspecified: Secondary | ICD-10-CM | POA: Diagnosis present

## 2013-10-27 DIAGNOSIS — N838 Other noninflammatory disorders of ovary, fallopian tube and broad ligament: Secondary | ICD-10-CM | POA: Diagnosis not present

## 2013-10-27 DIAGNOSIS — N870 Mild cervical dysplasia: Secondary | ICD-10-CM | POA: Diagnosis not present

## 2013-10-27 DIAGNOSIS — N840 Polyp of corpus uteri: Secondary | ICD-10-CM | POA: Diagnosis not present

## 2013-10-27 DIAGNOSIS — E039 Hypothyroidism, unspecified: Secondary | ICD-10-CM | POA: Diagnosis not present

## 2013-10-27 DIAGNOSIS — N926 Irregular menstruation, unspecified: Secondary | ICD-10-CM | POA: Insufficient documentation

## 2013-10-27 DIAGNOSIS — N920 Excessive and frequent menstruation with regular cycle: Secondary | ICD-10-CM | POA: Diagnosis not present

## 2013-10-27 DIAGNOSIS — D259 Leiomyoma of uterus, unspecified: Secondary | ICD-10-CM | POA: Diagnosis not present

## 2013-10-27 HISTORY — PX: BILATERAL SALPINGECTOMY: SHX5743

## 2013-10-27 HISTORY — PX: LAPAROSCOPIC ASSISTED VAGINAL HYSTERECTOMY: SHX5398

## 2013-10-27 SURGERY — HYSTERECTOMY, VAGINAL, LAPAROSCOPY-ASSISTED
Anesthesia: General | Site: Vagina

## 2013-10-27 MED ORDER — SENNA 8.6 MG PO TABS
1.0000 | ORAL_TABLET | Freq: Two times a day (BID) | ORAL | Status: DC
  Administered 2013-10-27: 8.6 mg via ORAL
  Filled 2013-10-27 (×5): qty 1

## 2013-10-27 MED ORDER — ZOLPIDEM TARTRATE 5 MG PO TABS: 5.0000 mg | ORAL_TABLET | Freq: Every evening | ORAL | Status: DC | PRN

## 2013-10-27 MED ORDER — MEPERIDINE HCL 25 MG/ML IJ SOLN: 6.2500 mg | INTRAMUSCULAR | Status: DC | PRN

## 2013-10-27 MED ORDER — SODIUM CHLORIDE 0.9 % IJ SOLN
INTRAMUSCULAR | Status: AC
  Filled 2013-10-27: qty 10

## 2013-10-27 MED ORDER — NEOSTIGMINE METHYLSULFATE 10 MG/10ML IV SOLN
INTRAVENOUS | Status: AC
  Filled 2013-10-27: qty 1

## 2013-10-27 MED ORDER — CEFAZOLIN SODIUM-DEXTROSE 2-3 GM-% IV SOLR
2.0000 g | INTRAVENOUS | Status: AC
  Administered 2013-10-27: 2 g via INTRAVENOUS

## 2013-10-27 MED ORDER — PROPOFOL 10 MG/ML IV BOLUS
INTRAVENOUS | Status: DC | PRN
  Administered 2013-10-27: 150 mg via INTRAVENOUS
  Administered 2013-10-27: 50 mg via INTRAVENOUS

## 2013-10-27 MED ORDER — BUPIVACAINE HCL (PF) 0.5 % IJ SOLN
INTRAMUSCULAR | Status: DC | PRN
  Administered 2013-10-27: 20 mL
  Administered 2013-10-27: 7 mL

## 2013-10-27 MED ORDER — FENTANYL CITRATE 0.05 MG/ML IJ SOLN
INTRAMUSCULAR | Status: AC
  Filled 2013-10-27: qty 5

## 2013-10-27 MED ORDER — KETOROLAC TROMETHAMINE 30 MG/ML IJ SOLN
INTRAMUSCULAR | Status: DC | PRN
  Administered 2013-10-27: 30 mg via INTRAVENOUS

## 2013-10-27 MED ORDER — PROPOFOL 10 MG/ML IV EMUL
INTRAVENOUS | Status: AC
  Filled 2013-10-27: qty 20

## 2013-10-27 MED ORDER — LIDOCAINE HCL (CARDIAC) 20 MG/ML IV SOLN
INTRAVENOUS | Status: DC | PRN
  Administered 2013-10-27: 100 mg via INTRAVENOUS

## 2013-10-27 MED ORDER — ROCURONIUM BROMIDE 100 MG/10ML IV SOLN
INTRAVENOUS | Status: DC | PRN
  Administered 2013-10-27: 50 mg via INTRAVENOUS

## 2013-10-27 MED ORDER — HYDROMORPHONE HCL PF 1 MG/ML IJ SOLN
INTRAMUSCULAR | Status: AC
  Filled 2013-10-27: qty 1

## 2013-10-27 MED ORDER — HYDROMORPHONE HCL PF 1 MG/ML IJ SOLN
0.2500 mg | INTRAMUSCULAR | Status: DC | PRN
  Administered 2013-10-27 (×4): 0.5 mg via INTRAVENOUS

## 2013-10-27 MED ORDER — HYDROMORPHONE HCL PF 1 MG/ML IJ SOLN
INTRAMUSCULAR | Status: DC | PRN
  Administered 2013-10-27: 2 mg via INTRAVENOUS
  Administered 2013-10-27: 1 mg via INTRAVENOUS

## 2013-10-27 MED ORDER — GLYCOPYRROLATE 0.2 MG/ML IJ SOLN
INTRAMUSCULAR | Status: AC
  Filled 2013-10-27: qty 3

## 2013-10-27 MED ORDER — MIDAZOLAM HCL 2 MG/2ML IJ SOLN
INTRAMUSCULAR | Status: DC | PRN
  Administered 2013-10-27: 0.5 mg via INTRAVENOUS
  Administered 2013-10-27: 1.5 mg via INTRAVENOUS

## 2013-10-27 MED ORDER — DIPHENHYDRAMINE HCL 12.5 MG/5ML PO ELIX
12.5000 mg | ORAL_SOLUTION | Freq: Four times a day (QID) | ORAL | Status: DC | PRN
  Filled 2013-10-27: qty 5

## 2013-10-27 MED ORDER — KETOROLAC TROMETHAMINE 30 MG/ML IJ SOLN: 15.0000 mg | Freq: Once | INTRAMUSCULAR | Status: DC | PRN

## 2013-10-27 MED ORDER — MIDAZOLAM HCL 2 MG/2ML IJ SOLN
INTRAMUSCULAR | Status: AC
  Filled 2013-10-27: qty 2

## 2013-10-27 MED ORDER — SCOPOLAMINE 1 MG/3DAYS TD PT72
MEDICATED_PATCH | TRANSDERMAL | Status: AC
  Administered 2013-10-27: 1.5 mg via TRANSDERMAL
  Filled 2013-10-27: qty 1

## 2013-10-27 MED ORDER — HYDROMORPHONE HCL PF 1 MG/ML IJ SOLN
INTRAMUSCULAR | Status: AC
  Administered 2013-10-27: 0.5 mg via INTRAVENOUS
  Filled 2013-10-27: qty 1

## 2013-10-27 MED ORDER — ACETAMINOPHEN 325 MG PO TABS: 650.0000 mg | ORAL_TABLET | ORAL | Status: DC | PRN

## 2013-10-27 MED ORDER — ONDANSETRON HCL 4 MG/2ML IJ SOLN
INTRAMUSCULAR | Status: AC
  Filled 2013-10-27: qty 2

## 2013-10-27 MED ORDER — ALUM & MAG HYDROXIDE-SIMETH 200-200-20 MG/5ML PO SUSP: 30.0000 mL | ORAL | Status: DC | PRN

## 2013-10-27 MED ORDER — LACTATED RINGERS IV SOLN
INTRAVENOUS | Status: DC
  Administered 2013-10-27 (×3): via INTRAVENOUS

## 2013-10-27 MED ORDER — KETOROLAC TROMETHAMINE 30 MG/ML IJ SOLN: 30.0000 mg | Freq: Four times a day (QID) | INTRAMUSCULAR | Status: DC | PRN

## 2013-10-27 MED ORDER — PROMETHAZINE HCL 25 MG/ML IJ SOLN: 6.2500 mg | INTRAMUSCULAR | Status: DC | PRN

## 2013-10-27 MED ORDER — DEXAMETHASONE SODIUM PHOSPHATE 10 MG/ML IJ SOLN
INTRAMUSCULAR | Status: DC | PRN
  Administered 2013-10-27: 10 mg via INTRAVENOUS

## 2013-10-27 MED ORDER — ACETAMINOPHEN 160 MG/5ML PO SOLN
ORAL | Status: AC
  Administered 2013-10-27: 975 mg via ORAL
  Filled 2013-10-27: qty 40.6

## 2013-10-27 MED ORDER — OXYCODONE-ACETAMINOPHEN 5-325 MG PO TABS: 1.0000 | ORAL_TABLET | ORAL | Status: DC | PRN

## 2013-10-27 MED ORDER — BUPIVACAINE HCL (PF) 0.5 % IJ SOLN
INTRAMUSCULAR | Status: AC
  Filled 2013-10-27: qty 30

## 2013-10-27 MED ORDER — ONDANSETRON HCL 4 MG PO TABS: 4.0000 mg | ORAL_TABLET | Freq: Four times a day (QID) | ORAL | Status: DC | PRN

## 2013-10-27 MED ORDER — GLYCOPYRROLATE 0.2 MG/ML IJ SOLN
INTRAMUSCULAR | Status: DC | PRN
  Administered 2013-10-27: 0.4 mg via INTRAVENOUS
  Administered 2013-10-27 (×2): 0.1 mg via INTRAVENOUS

## 2013-10-27 MED ORDER — DIPHENHYDRAMINE HCL 50 MG/ML IJ SOLN: 12.5000 mg | Freq: Four times a day (QID) | INTRAMUSCULAR | Status: DC | PRN

## 2013-10-27 MED ORDER — SERTRALINE HCL 50 MG PO TABS
50.0000 mg | ORAL_TABLET | Freq: Every day | ORAL | Status: DC
  Filled 2013-10-27 (×3): qty 1

## 2013-10-27 MED ORDER — LEVOTHYROXINE SODIUM 88 MCG PO TABS
88.0000 ug | ORAL_TABLET | Freq: Every day | ORAL | Status: DC
  Administered 2013-10-28: 88 ug via ORAL
  Filled 2013-10-27 (×3): qty 1

## 2013-10-27 MED ORDER — SODIUM CHLORIDE 0.9 % IJ SOLN: 9.0000 mL | INTRAMUSCULAR | Status: DC | PRN

## 2013-10-27 MED ORDER — LACTATED RINGERS IV SOLN
INTRAVENOUS | Status: DC
  Administered 2013-10-27 – 2013-10-28 (×3): via INTRAVENOUS

## 2013-10-27 MED ORDER — ROCURONIUM BROMIDE 100 MG/10ML IV SOLN
INTRAVENOUS | Status: AC
  Filled 2013-10-27: qty 1

## 2013-10-27 MED ORDER — ACETAMINOPHEN 160 MG/5ML PO SOLN: 975.0000 mg | Freq: Once | ORAL | Status: AC

## 2013-10-27 MED ORDER — ACETAMINOPHEN 325 MG PO TABS: 325.0000 mg | ORAL_TABLET | ORAL | Status: DC | PRN

## 2013-10-27 MED ORDER — SCOPOLAMINE 1 MG/3DAYS TD PT72
1.0000 | MEDICATED_PATCH | Freq: Once | TRANSDERMAL | Status: AC
  Administered 2013-10-27: 1 via TRANSDERMAL

## 2013-10-27 MED ORDER — FENTANYL CITRATE 0.05 MG/ML IJ SOLN
INTRAMUSCULAR | Status: DC | PRN
  Administered 2013-10-27: 100 ug via INTRAVENOUS
  Administered 2013-10-27: 150 ug via INTRAVENOUS

## 2013-10-27 MED ORDER — LIDOCAINE HCL (CARDIAC) 20 MG/ML IV SOLN
INTRAVENOUS | Status: AC
  Filled 2013-10-27: qty 5

## 2013-10-27 MED ORDER — NALOXONE HCL 0.4 MG/ML IJ SOLN: 0.4000 mg | INTRAMUSCULAR | Status: DC | PRN

## 2013-10-27 MED ORDER — CEFAZOLIN SODIUM-DEXTROSE 2-3 GM-% IV SOLR
INTRAVENOUS | Status: AC
  Filled 2013-10-27: qty 50

## 2013-10-27 MED ORDER — DEXAMETHASONE SODIUM PHOSPHATE 10 MG/ML IJ SOLN
INTRAMUSCULAR | Status: AC
  Filled 2013-10-27: qty 1

## 2013-10-27 MED ORDER — HYDROMORPHONE 0.3 MG/ML IV SOLN
INTRAVENOUS | Status: DC
  Administered 2013-10-27: 11:00:00 via INTRAVENOUS
  Administered 2013-10-27: 0.4 mg via INTRAVENOUS
  Administered 2013-10-27: 0.999 mg via INTRAVENOUS
  Administered 2013-10-28: 1.3 mg via INTRAVENOUS
  Administered 2013-10-28: 0.2 mg via INTRAVENOUS
  Administered 2013-10-28: 0.199 mg via INTRAVENOUS
  Filled 2013-10-27: qty 25

## 2013-10-27 MED ORDER — MENTHOL 3 MG MT LOZG: 1.0000 | LOZENGE | OROMUCOSAL | Status: DC | PRN

## 2013-10-27 MED ORDER — HEPARIN SODIUM (PORCINE) 5000 UNIT/ML IJ SOLN
INTRAMUSCULAR | Status: AC
  Filled 2013-10-27: qty 1

## 2013-10-27 MED ORDER — KETOROLAC TROMETHAMINE 30 MG/ML IJ SOLN
INTRAMUSCULAR | Status: AC
  Filled 2013-10-27: qty 1

## 2013-10-27 MED ORDER — ACETAMINOPHEN 160 MG/5ML PO SOLN: 325.0000 mg | ORAL | Status: DC | PRN

## 2013-10-27 MED ORDER — NEOSTIGMINE METHYLSULFATE 10 MG/10ML IV SOLN
INTRAVENOUS | Status: DC | PRN
  Administered 2013-10-27: 3 mg via INTRAVENOUS

## 2013-10-27 MED ORDER — ONDANSETRON HCL 4 MG/2ML IJ SOLN: 4.0000 mg | Freq: Four times a day (QID) | INTRAMUSCULAR | Status: DC | PRN

## 2013-10-27 MED ORDER — ONDANSETRON HCL 4 MG/2ML IJ SOLN
INTRAMUSCULAR | Status: DC | PRN
  Administered 2013-10-27 (×2): 2 mg via INTRAVENOUS

## 2013-10-27 SURGICAL SUPPLY — 47 items
BLADE SURG 10 STRL SS (BLADE) ×4 IMPLANT
BLADE SURG 11 STRL SS (BLADE) ×8 IMPLANT
CABLE HIGH FREQUENCY MONO STRZ (ELECTRODE) IMPLANT
CATH ROBINSON RED A/P 16FR (CATHETERS) ×4 IMPLANT
CLOSURE WOUND 1/4 X3 (GAUZE/BANDAGES/DRESSINGS)
CLOTH BEACON ORANGE TIMEOUT ST (SAFETY) ×4 IMPLANT
CONT PATH 16OZ SNAP LID 3702 (MISCELLANEOUS) ×4 IMPLANT
COVER TABLE BACK 60X90 (DRAPES) ×4 IMPLANT
DECANTER SPIKE VIAL GLASS SM (MISCELLANEOUS) ×4 IMPLANT
DERMABOND ADVANCED (GAUZE/BANDAGES/DRESSINGS) ×2
DERMABOND ADVANCED .7 DNX12 (GAUZE/BANDAGES/DRESSINGS) ×2 IMPLANT
DRSG COVADERM PLUS 2X2 (GAUZE/BANDAGES/DRESSINGS) ×4 IMPLANT
DRSG OPSITE POSTOP 3X4 (GAUZE/BANDAGES/DRESSINGS) ×4 IMPLANT
DURAPREP 26ML APPLICATOR (WOUND CARE) ×4 IMPLANT
ELECT LIGASURE LONG (ELECTRODE) IMPLANT
ELECT REM PT RETURN 9FT ADLT (ELECTROSURGICAL) ×4
ELECTRODE REM PT RTRN 9FT ADLT (ELECTROSURGICAL) ×2 IMPLANT
FILTER SMOKE EVAC LAPAROSHD (FILTER) ×4 IMPLANT
GLOVE BIO SURGEON STRL SZ7 (GLOVE) ×12 IMPLANT
GLOVE BIOGEL PI IND STRL 6.5 (GLOVE) ×4 IMPLANT
GLOVE BIOGEL PI IND STRL 7.0 (GLOVE) ×6 IMPLANT
GLOVE BIOGEL PI INDICATOR 6.5 (GLOVE) ×4
GLOVE BIOGEL PI INDICATOR 7.0 (GLOVE) ×6
GOWN STRL REUS W/ TWL LRG LVL3 (GOWN DISPOSABLE) ×14 IMPLANT
GOWN STRL REUS W/TWL LRG LVL3 (GOWN DISPOSABLE) ×14
NEEDLE INSUFFLATION 120MM (ENDOMECHANICALS) ×4 IMPLANT
NS IRRIG 1000ML POUR BTL (IV SOLUTION) ×4 IMPLANT
PACK LAVH (CUSTOM PROCEDURE TRAY) ×4 IMPLANT
PROTECTOR NERVE ULNAR (MISCELLANEOUS) ×4 IMPLANT
SCISSORS LAP 5X45 EPIX DISP (ENDOMECHANICALS) IMPLANT
SEALER TISSUE G2 CVD JAW 45CM (ENDOMECHANICALS) ×8 IMPLANT
SET CYSTO W/LG BORE CLAMP LF (SET/KITS/TRAYS/PACK) IMPLANT
SET IRRIG TUBING LAPAROSCOPIC (IRRIGATION / IRRIGATOR) IMPLANT
STRIP CLOSURE SKIN 1/4X3 (GAUZE/BANDAGES/DRESSINGS) IMPLANT
SUT MNCRL 0 MO-4 VIOLET 18 CR (SUTURE) ×6 IMPLANT
SUT MNCRL 0 VIOLET 6X18 (SUTURE) ×2 IMPLANT
SUT MONOCRYL 0 6X18 (SUTURE) ×2
SUT MONOCRYL 0 MO 4 18  CR/8 (SUTURE) ×6
SUT VIC AB 4-0 PS2 27 (SUTURE) ×4 IMPLANT
SUT VICRYL 0 UR6 27IN ABS (SUTURE) ×8 IMPLANT
SYR 30ML LL (SYRINGE) ×4 IMPLANT
TOWEL OR 17X24 6PK STRL BLUE (TOWEL DISPOSABLE) ×8 IMPLANT
TRAY FOLEY CATH 14FR (SET/KITS/TRAYS/PACK) ×4 IMPLANT
TROCAR XCEL NON-BLD 11X100MML (ENDOMECHANICALS) ×4 IMPLANT
TROCAR XCEL NON-BLD 5MMX100MML (ENDOMECHANICALS) ×4 IMPLANT
WARMER LAPAROSCOPE (MISCELLANEOUS) ×4 IMPLANT
WATER STERILE IRR 1000ML POUR (IV SOLUTION) ×4 IMPLANT

## 2013-10-27 NOTE — Anesthesia Postprocedure Evaluation (Signed)
  Anesthesia Post-op Note  Anesthesia Post Note  Patient: Amanda Koch  Procedure(s) Performed: Procedure(s) (LRB):  LAPAROSCOPIC ASSISTED VAGINAL HYSTERECTOMY (N/A) LAPAROSCOPIC BILATERAL SALPINGECTOMY  (Bilateral)  Anesthesia type: General  Patient location: [facility]  Post pain: Pain level controlled  Post assessment: Post-op Vital signs reviewed  Last Vitals:  Filed Vitals:   10/27/13 1700  BP:   Pulse:   Temp:   Resp: 11    Post vital signs: Reviewed  Level of consciousness: sedated  Complications: No apparent anesthesia complications

## 2013-10-27 NOTE — Progress Notes (Signed)
No changes to H&P per patient history Reviewed with patient procedure-LAVH/Bilat salpingectomy, possible bilateral oophorectomy All questions answered

## 2013-10-27 NOTE — Transfer of Care (Signed)
Immediate Anesthesia Transfer of Care Note  Patient: Amanda Koch  Procedure(s) Performed: Procedure(s):  LAPAROSCOPIC ASSISTED VAGINAL HYSTERECTOMY (N/A) LAPAROSCOPIC BILATERAL SALPINGECTOMY  (Bilateral)  Patient Location: PACU  Anesthesia Type:General  Level of Consciousness: awake, alert  and oriented  Airway & Oxygen Therapy: Patient Spontanous Breathing and Patient connected to nasal cannula oxygen  Post-op Assessment: Report given to PACU RN, Post -op Vital signs reviewed and stable and Patient moving all extremities X 4  Post vital signs: Reviewed and stable  Complications: No apparent anesthesia complications

## 2013-10-27 NOTE — Anesthesia Postprocedure Evaluation (Signed)
Anesthesia Post Note  Patient: Amanda Koch  Procedure(s) Performed: Procedure(s) (LRB):  LAPAROSCOPIC ASSISTED VAGINAL HYSTERECTOMY (N/A) LAPAROSCOPIC BILATERAL SALPINGECTOMY  (Bilateral)  Anesthesia type: General  Patient location: PACU  Post pain: Pain level controlled  Post assessment: Post-op Vital signs reviewed  Last Vitals:  Filed Vitals:   10/27/13 1000  BP: 122/69  Pulse: 58  Temp:   Resp: 11    Post vital signs: Reviewed  Level of consciousness: sedated  Complications: No apparent anesthesia complications

## 2013-10-27 NOTE — Op Note (Signed)
NAMESHAQUAYA, Amanda Koch            ACCOUNT NO.:  [id number]  MEDICAL RECORD NO.:  [id number]  LOCATION:  [facility]                          FACILITY:  [facility]  PHYSICIAN:  Daleen Bo. Gaetano Net, M.D. DATE OF BIRTH:  07-15-1965  DATE OF PROCEDURE:  10/27/2013 DATE OF DISCHARGE:                              OPERATIVE REPORT   PREOPERATIVE DIAGNOSES: 1. Uterine leiomyomata. 2. Menorrhagia.  POSTOPERATIVE DIAGNOSES: 1. Uterine leiomyomata. 2. Menorrhagia.  PROCEDURE:  Laparoscopically-assisted vaginal hysterectomy with bilateral salpingectomy.  SURGEON:  Daleen Bo. Gaetano Net, M.D.  ASSISTANTTrinda Pascal Morris, DO.  ANESTHESIA:  General with endotracheal intubation.  ESTIMATED BLOOD LOSS:  250 mL.  SPECIMENS:  Uterus and bilateral fallopian tubes to Pathology.  INDICATIONS AND CONSENT:  This patient is a 48 year old patient who has uterine leiomyomata and increasingly heavy menses.  Details are dictated in the history and physical.  Laparoscopically assisted vaginal hysterectomy with bilateral salpingectomy and removal of one or both ovaries if distinctly abnormal has been discussed with the patient preoperatively.  Potential risks and complications have been reviewed preoperatively including not limited to, infection, organ damage, bleeding requiring transfusion of blood products with HIV and hepatitis acquisition, DVT, PE, pneumonia, laparotomy, pelvic pain, abdominal pain, painful intercourse.  Return to the operating room, fistula formation.  All questions have been answered and consent was signed on the chart.  FINDINGS:  Upper abdomen is grossly normal.  Uterus is 10 weeks in size. Fallopian tubes were normal.  Ovaries were normal.  Anterior-posterior cul-de-sacs were normal.  PROCEDURE:  The patient was taken to the operating room, where she was identified, placed in dorsal supine position.  General anesthesia was induced via endotracheal intubation.  She was placed in a  dorsal lithotomy position.  She was prepped.  Time-out was undertaken.  Hulka tenaculum was placed in the uterus as a manipulator and she has been straight cathed as well.  She was draped in a sterile fashion.  The infraumbilical and suprapubic areas were injected in the midline with approximately 7 mL of 0.5% plain Marcaine.  Small infraumbilical incision was made and dissection was carried out in layers to the peritoneum which was bluntly entered without difficulty.  A pursestring suture of 0 Vicryl was placed on the fascia under good visualization and held for later use.  The disposable Hasson trocar sleeve was placed, the balloon was inflated and placement was verified with the laparoscope. Pneumoperitoneum was induced.  The small suprapubic incision was made in the midline and a 5-mm disposable trocar sleeve was placed under direct visualization without difficulty.  The above findings were noted.  Then, using the EnSeal bipolar cautery cutting instrument, the right fallopian tube was taken down and this was carried across the uteroovarian round ligament down the level of vesicouterine peritoneum.  Similar procedure was carried out on the left.  Vesicouterine peritoneum was taken down cephalad laterally.  The suprapubic trocar sleeve was removed.  The instruments were removed and attention was turned to the vagina. Posterior cul-de-sacs entered sharply.  Cervix was circumscribed with unipolar cautery.  Mucosa was advanced sharply and bluntly.  Anterior cul-de-sacs entered without difficulty.  Using the LigaSure and bipolar handheld cautery, progressive bites were taken of  the uterosacral ligaments, the bladder pillars, cardinal ligaments, and uterine vessels. Fundus was delivered posteriorly.  The ligaments were taken down.  The specimen was fully delivered.  All suture be 0 Monocryl unless otherwise designated.  Uterosacral ligaments were plicated the cuff bilaterally with separate  sutures.  Suture was also placed at the 3 and 9 o'clock to help maintain hemostasis on the cuff.  Uterosacral ligaments were then plicated the midline with a third suture and the cuff was closed with figure-of-eights.  Foley catheter was placed in the bladder and clear urine was noted.  Attention was returned to the abdomen and pneumoperitoneum was reintroduced.  Under direct visualization, the suprapubic trocar sleeve was reintroduced as well.  Irrigation was carried out.  Minor bleeders at peritoneal edges were controlled with bipolar cautery.  The remaining approximately 23 mL of 0.5% plain Marcaine is instilled into the peritoneal cavity.  Suprapubic trocar sleeve was removed.  Pneumoperitoneum was reduced and the umbilical trocar sleeve was removed.  Pursestring stitches tied down and inspection revealed good closure of the fascia.  A 0 Vicryl was also used to reapproximate the subcutaneous tissues and 2-0 Vicryl was used to close the skin with interrupted stitches on both incisions. Dermabond was placed on both incisions as well.  All counts were correct.  The patient is awakened, taken to recovery room in stable condition.     Daleen Bo Gaetano Net, M.D.     JET/MEDQ  D:  10/27/2013  T:  10/27/2013  Job:  [id number]

## 2013-10-27 NOTE — Anesthesia Preprocedure Evaluation (Signed)
Anesthesia Evaluation  Patient identified by MRN, date of birth, ID band Patient awake    Reviewed: Allergy & Precautions, H&P , NPO status , Patient's Chart, lab work & pertinent test results, reviewed documented beta blocker date and time   History of Anesthesia Complications Negative for: history of anesthetic complications  Airway Mallampati: I TM Distance: >3 FB Neck ROM: full    Dental  (+) Teeth Intact   Pulmonary neg pulmonary ROS,  breath sounds clear to auscultation  Pulmonary exam normal       Cardiovascular Exercise Tolerance: Good Rhythm:regular Rate:Normal     Neuro/Psych Depression negative neurological ROS     GI/Hepatic negative GI ROS, Neg liver ROS,   Endo/Other  Hypothyroidism BMI 34.8  Renal/GU negative Renal ROS  Female GU complaint     Musculoskeletal   Abdominal   Peds  Hematology negative hematology ROS (+)   Anesthesia Other Findings   Reproductive/Obstetrics negative OB ROS                           Anesthesia Physical Anesthesia Plan  ASA: II  Anesthesia Plan: General ETT   Post-op Pain Management:    Induction:   Airway Management Planned:   Additional Equipment:   Intra-op Plan:   Post-operative Plan:   Informed Consent: I have reviewed the patients History and Physical, chart, labs and discussed the procedure including the risks, benefits and alternatives for the proposed anesthesia with the patient or authorized representative who has indicated his/her understanding and acceptance.   Dental Advisory Given  Plan Discussed with: CRNA and Surgeon  Anesthesia Plan Comments:         Anesthesia Quick Evaluation

## 2013-10-27 NOTE — Brief Op Note (Signed)
10/27/2013  9:09 AM  PATIENT:  Amanda Koch  48 y.o. female  PRE-OPERATIVE DIAGNOSIS:  menorrhagia, fibroids  POST-OPERATIVE DIAGNOSIS:  fibroids,menorrhagia  PROCEDURE:  Procedure(s):  LAPAROSCOPIC ASSISTED VAGINAL HYSTERECTOMY (N/A) LAPAROSCOPIC BILATERAL SALPINGECTOMY  (Bilateral)  SURGEON:  Surgeon(s) and Role:    * Allena Katz, MD - Primary    * Linda Hedges, DO - Assisting  PHYSICIAN ASSISTANT:   ASSISTANTS: Morris   ANESTHESIA:   general  EBL:  Total I/O In: 1000 [I.V.:1000] Out: 220 [Urine:20; Blood:200]  BLOOD ADMINISTERED:none  DRAINS: Urinary Catheter (Foley)   LOCAL MEDICATIONS USED:  Amount: 30 ml and OTHER 0.5% Marcaine  SPECIMEN:  Source of Specimen:  uterus, bilateral fallopian tubes  DISPOSITION OF SPECIMEN:  PATHOLOGY  COUNTS:  YES  TOURNIQUET:  * No tourniquets in log *  DICTATION: .Other Dictation: Dictation Number [id number]  PLAN OF CARE: Admit for overnight observation  PATIENT DISPOSITION:  PACU - hemodynamically stable.   Delay start of Pharmacological VTE agent (>24hrs) due to surgical blood loss or risk of bleeding: not applicable

## 2013-10-27 NOTE — Addendum Note (Signed)
Addendum created 10/27/13 1747 by Billie Lade, CRNA   Modules edited: Notes Section   Notes Section:  File: [id number]

## 2013-10-27 NOTE — Progress Notes (Signed)
Good pain relief, tolerating reg diet, no flatus yet  VSS Afeb Lungs CTA Cor RRR Abd soft, good BS Ext PAS on  A: Stable  P: per orders

## 2013-10-28 ENCOUNTER — Encounter (HOSPITAL_COMMUNITY): Payer: Self-pay | Admitting: Obstetrics and Gynecology

## 2013-10-28 DIAGNOSIS — N920 Excessive and frequent menstruation with regular cycle: Secondary | ICD-10-CM | POA: Diagnosis not present

## 2013-10-28 LAB — CBC
HCT: 34.3 % — ABNORMAL LOW (ref 36.0–46.0)
Hemoglobin: 11.4 g/dL — ABNORMAL LOW (ref 12.0–15.0)
MCH: 30.2 pg (ref 26.0–34.0)
MCHC: 33.2 g/dL (ref 30.0–36.0)
MCV: 90.7 fL (ref 78.0–100.0)
PLATELETS: 215 10*3/uL (ref 150–400)
RBC: 3.78 MIL/uL — ABNORMAL LOW (ref 3.87–5.11)
RDW: 13.4 % (ref 11.5–15.5)
WBC: 7.9 10*3/uL (ref 4.0–10.5)

## 2013-10-28 MED ORDER — HYDROMORPHONE HCL 2 MG PO TABS
2.0000 mg | ORAL_TABLET | ORAL | Status: DC | PRN
Start: 2013-10-28 — End: 2019-12-21

## 2013-10-28 MED ORDER — ACETAMINOPHEN 325 MG PO TABS: 650.0000 mg | ORAL_TABLET | Freq: Four times a day (QID) | ORAL | Status: DC | PRN

## 2013-10-28 NOTE — Discharge Summary (Signed)
Physician Discharge Summary  Patient ID: Amanda Koch MRN: [id number] DOB/AGE: [birth year] 50 y.o.  Admit date: 10/27/2013 Discharge date: 10/28/2013  Admission Diagnoses:fibroids   Discharge Diagnoses:  Active Problems:   Fibroids   Discharged Condition: good  Hospital Course: good return of bowel and bladder function, ambulating, tolerating regular diet, good pain relief, passing flatus.  Consults: None  Significant Diagnostic Studies: labs:  Results for orders placed during the hospital encounter of 10/27/13 (from the past 24 hour(s))  CBC     Status: Abnormal   Collection Time    10/28/13  5:21 AM      Result Value Ref Range   WBC 7.9  4.0 - 10.5 K/uL   RBC 3.78 (*) 3.87 - 5.11 MIL/uL   Hemoglobin 11.4 (*) 12.0 - 15.0 g/dL   HCT 34.3 (*) 36.0 - 46.0 %   MCV 90.7  78.0 - 100.0 fL   MCH 30.2  26.0 - 34.0 pg   MCHC 33.2  30.0 - 36.0 g/dL   RDW 13.4  11.5 - 15.5 %   Platelets 215  150 - 400 K/uL    Treatments: surgery: LAVH, bilateral salpingectomy  Discharge Exam: Blood pressure 114/61, pulse 64, temperature 98.6 F (37 C), temperature source Oral, resp. rate 18, height 5' 6.75" (1.695 m), weight 99.791 kg (220 lb), SpO2 95.00%. General appearance: alert, cooperative and no distress GI: soft, non-tender; bowel sounds normal; no masses,  no organomegaly  Disposition: 01-Home or Self Care     Medication List    STOP taking these medications       cholecalciferol 1000 UNITS tablet  Commonly known as:  VITAMIN D     ferrous sulfate 325 (65 FE) MG tablet     sertraline 50 MG tablet  Commonly known as:  ZOLOFT     vitamin B-12 1000 MCG tablet  Commonly known as:  CYANOCOBALAMIN      TAKE these medications       acetaminophen 325 MG tablet  Commonly known as:  TYLENOL  Take 2 tablets (650 mg total) by mouth every 6 (six) hours as needed for mild pain.     fexofenadine 180 MG tablet  Commonly known as:  ALLEGRA  Take 180 mg by mouth daily as needed  for allergies.     HYDROmorphone 2 MG tablet  Commonly known as:  DILAUDID  Take 1 tablet (2 mg total) by mouth every 4 (four) hours as needed for severe pain.     levothyroxine 88 MCG tablet  Commonly known as:  SYNTHROID, LEVOTHROID  Take 88 mcg by mouth daily.     multivitamin with minerals tablet  Take 1 tablet by mouth daily.         Signed: Yazmeen Woolf II,Donielle Radziewicz E 10/28/2013, 1:05 PM

## 2013-10-28 NOTE — Progress Notes (Signed)
UR chart review completed.  

## 2013-10-28 NOTE — Progress Notes (Signed)
+   flatus, tol regular diet, ambulating , voiding, ready to go home  VSS Afeb Abd soft with good BS Ext PAS on  Results for orders placed during the hospital encounter of 10/27/13 (from the past 24 hour(s))  CBC     Status: Abnormal   Collection Time    10/28/13  5:21 AM      Result Value Ref Range   WBC 7.9  4.0 - 10.5 K/uL   RBC 3.78 (*) 3.87 - 5.11 MIL/uL   Hemoglobin 11.4 (*) 12.0 - 15.0 g/dL   HCT 34.3 (*) 36.0 - 46.0 %   MCV 90.7  78.0 - 100.0 fL   MCH 30.2  26.0 - 34.0 pg   MCHC 33.2  30.0 - 36.0 g/dL   RDW 13.4  11.5 - 15.5 %   Platelets 215  150 - 400 K/uL    A: Satisfactory  P: D/C Home

## 2013-10-28 NOTE — Progress Notes (Signed)
Pt is discharged in the care of [facility] per wheelchair. Discharge instructions with Rx were given to pt. Understood all instructions well Questions were asked and answered. No equipment needed for home use. Denies any pain or discomfort.

## 2014-08-25 ENCOUNTER — Other Ambulatory Visit: Payer: Self-pay | Admitting: Obstetrics and Gynecology

## 2014-08-26 LAB — CYTOLOGY - PAP

## 2017-08-18 ENCOUNTER — Emergency Department (HOSPITAL_BASED_OUTPATIENT_CLINIC_OR_DEPARTMENT_OTHER): Payer: 59

## 2017-08-18 ENCOUNTER — Other Ambulatory Visit: Payer: Self-pay

## 2017-08-18 ENCOUNTER — Encounter (HOSPITAL_BASED_OUTPATIENT_CLINIC_OR_DEPARTMENT_OTHER): Payer: Self-pay | Admitting: Emergency Medicine

## 2017-08-18 ENCOUNTER — Emergency Department (HOSPITAL_BASED_OUTPATIENT_CLINIC_OR_DEPARTMENT_OTHER)
Admission: EM | Admit: 2017-08-18 | Discharge: 2017-08-18 | Disposition: A | Discharge status: Home / Self Care | Payer: 59 | Attending: Emergency Medicine | Admitting: Emergency Medicine

## 2017-08-18 DIAGNOSIS — E039 Hypothyroidism, unspecified: Secondary | ICD-10-CM | POA: Diagnosis not present

## 2017-08-18 DIAGNOSIS — Z79899 Other long term (current) drug therapy: Secondary | ICD-10-CM | POA: Insufficient documentation

## 2017-08-18 DIAGNOSIS — Z9884 Bariatric surgery status: Secondary | ICD-10-CM | POA: Insufficient documentation

## 2017-08-18 DIAGNOSIS — F329 Major depressive disorder, single episode, unspecified: Secondary | ICD-10-CM | POA: Diagnosis not present

## 2017-08-18 DIAGNOSIS — M25562 Pain in left knee: Secondary | ICD-10-CM

## 2017-08-18 NOTE — ED Provider Notes (Signed)
[facility] Provider Note   CSN: [id number] Arrival date & time: 08/18/17  1826     History   Chief Complaint Chief Complaint  Patient presents with  . Knee Pain    HPI Amanda Koch is a 52 y.o. female.  The history is provided by the patient and medical records. No language interpreter was used.  Knee Pain   Pertinent negatives include no numbness.  Amanda Koch is a 52 y.o. female  who presents to the Emergency Department complaining of acute onset of left knee pain which began today just prior to arrival.  Patient states that she was walking in a restaurant when she believes that she slipped on some water.  She did not fall, but felt as if her knee caught oddly.  Since this occurred, she has been having pain with ambulation and with bending the left knee.  Denies any history of similar, but is followed by [facility] orthopedics for Baker's cyst of the same leg.  Applied ice to the area which did help with her pain.  No medications taken prior to arrival for symptoms.  No numbness, weakness or tingling.   Past Medical History:  Diagnosis Date  . Depression   . Hypothyroidism   . Vaginal delivery 2000, 2003  . Wears glasses     Patient Active Problem List   Diagnosis Date Noted  . Fibroids 10/27/2013    Past Surgical History:  Procedure Laterality Date  . BILATERAL SALPINGECTOMY Bilateral 10/27/2013   Procedure: LAPAROSCOPIC BILATERAL SALPINGECTOMY ;  Surgeon: Allena Katz, MD;  Location: [facility];  Service: Gynecology;  Laterality: Bilateral;  . BREAST EXCISIONAL BIOPSY  2007   rt breast nipple exploration for d/c  . GALLBLADDER SURGERY    . I&D EXTREMITY Left 05/12/2012   Procedure: LEFT INDEX FINGER IRRIGATION AND DEBRIDEMENT AND REMOVAL FOREIGN BODY ;  Surgeon: Tennis Must, MD;  Location: [facility] [place];  Service: Orthopedics;  Laterality: Left;  . LAPAROSCOPIC ASSISTED VAGINAL HYSTERECTOMY N/A 10/27/2013   Procedure:  LAPAROSCOPIC ASSISTED VAGINAL HYSTERECTOMY;  Surgeon: Allena Katz, MD;  Location: [facility];  Service: Gynecology;  Laterality: N/A;  . ROUX-EN-Y PROCEDURE  2008  . UMBILICAL HERNIA REPAIR  2008  . WISDOM TOOTH EXTRACTION       OB History   None      Home Medications    Prior to Admission medications   Medication Sig Start Date End Date Taking? Authorizing Provider  acetaminophen (TYLENOL) 325 MG tablet Take 2 tablets (650 mg total) by mouth every 6 (six) hours as needed for mild pain. 10/28/13   Everlene Farrier, MD  fexofenadine (ALLEGRA) 180 MG tablet Take 180 mg by mouth daily as needed for allergies.    [provider]  HYDROmorphone (DILAUDID) 2 MG tablet Take 1 tablet (2 mg total) by mouth every 4 (four) hours as needed for severe pain. 10/28/13   Everlene Farrier, MD  levothyroxine (SYNTHROID, LEVOTHROID) 88 MCG tablet Take 88 mcg by mouth daily.    [provider]  Multiple Vitamins-Minerals (MULTIVITAMIN WITH MINERALS) tablet Take 1 tablet by mouth daily.    [provider]    Family History No family history on file.  Social History Social History   Tobacco Use  . Smoking status: Never Smoker  . Smokeless tobacco: Never Used  Substance Use Topics  . Alcohol use: Yes    Comment: rare  . Drug use: No  Allergies   Patient has no known allergies.   Review of Systems Review of Systems  Musculoskeletal: Positive for arthralgias and myalgias.  Skin: Negative for color change and wound.  Neurological: Negative for weakness and numbness.     Physical Exam Updated Vital Signs BP 120/66 (BP Location: Left Arm)   Pulse 69   Temp 98.4 F (36.9 C) (Oral)   Resp 16   Ht 5\' 6"  (1.676 m)   Wt 90.3 kg (199 lb)   LMP 10/12/2013   SpO2 97%   BMI 32.12 kg/m   Physical Exam  Constitutional: She appears well-developed and well-nourished. No distress.  HENT:  Head: Normocephalic and atraumatic.  Neck: Neck supple.    Cardiovascular: Normal rate, regular rhythm and normal heart sounds.  No murmur heard. Pulmonary/Chest: Effort normal and breath sounds normal. No respiratory distress. She has no wheezes. She has no rales.  Musculoskeletal:  Tenderness to palpation along the patella and patellar tendon.  Full range of motion of the knee.  No joint line tenderness. No joint effusion or swelling appreciated. No abnormal alignment or patellar mobility. No bruising, erythema or warmth overlaying the joint. No varus/valgus laxity. Negative drawer's, Lachman's and McMurray's.  No crepitus. 2+ DP pulses bilaterally. All compartments are soft. Sensation intact distal to injury.  Neurological: She is alert.  Skin: Skin is warm and dry.  Nursing note and vitals reviewed.    ED Treatments / Results  Labs (all labs ordered are listed, but only abnormal results are displayed) Labs Reviewed - No data to display  EKG None  Radiology Dg Knee Complete 4 Views Left  Result Date: 08/18/2017 CLINICAL DATA:  53 y/o F; it and felt a pop in her left knee this morning. Recent Baker cyst. EXAM: LEFT KNEE - COMPLETE 4+ VIEW COMPARISON:  03/03/2004 left knee radiographs FINDINGS: No evidence of fracture, dislocation, or joint effusion. Small patellofemoral compartment and medial femorotibial compartment periarticular osteophytes. Mild medial femorotibial compartment joint space narrowing. IMPRESSION: 1.  No acute fracture or dislocation identified. 2. 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 Electronically Signed   By: Kristine Garbe M.D.   On: 08/18/2017 19:46    Procedures Procedures (including critical care time)  Medications Ordered in ED Medications - No data to display   Initial Impression / Assessment and Plan / ED Course  I have reviewed the triage vital signs and the nursing notes.  Pertinent labs & imaging results that were available during my care of the patient  were reviewed by me and considered in my medical decision making (see chart for details).    Amanda Koch is a 52 y.o. female who presents to ED for acute onset of left knee pain after slipping on some water and catching knee oddly.  She did not fall, but feels as if she may have twisted the knee.  Neurovascularly intact on exam.  She has tenderness along the patella and patellar tendon.  She does have full range of motion of the knee although this does exacerbate her pain.  X-ray obtained negative.  She is followed by [facility] orthopedics.  She will follow-up if her symptoms do not improve with supportive care. She has knee brace, but requesting knee immobilizer instead - provided along with crutches.  Questions answered.   Final Clinical Impressions(s) / ED Diagnoses   Final diagnoses:  Acute pain of left knee    ED Discharge Orders    None  Ward, Ozella Almond, PA-C 08/18/17 1959    Malvin Johns, MD 08/18/17 [phone]

## 2017-08-18 NOTE — Discharge Instructions (Signed)
It was my pleasure taking care of you today!   Tylenol as needed for pain. Use crutches as needed for comfort. Ice and elevate knee throughout the day.  Follow up with the orthopedist listed if symptoms do not improve in one week.   Return to the ER for new or worsening symptoms, any additional concerns.

## 2017-08-18 NOTE — ED Triage Notes (Signed)
L knee pain after slipping today and twisting.

## 2017-09-23 ENCOUNTER — Other Ambulatory Visit: Payer: Self-pay | Admitting: Obstetrics and Gynecology

## 2017-09-23 DIAGNOSIS — R928 Other abnormal and inconclusive findings on diagnostic imaging of breast: Secondary | ICD-10-CM

## 2017-09-25 ENCOUNTER — Ambulatory Visit: Payer: 59

## 2017-09-25 ENCOUNTER — Ambulatory Visit
Admission: RE | Admit: 2017-09-25 | Discharge: 2017-09-25 | Disposition: A | Discharge status: Home / Self Care | Payer: 59 | Source: Ambulatory Visit | Attending: Obstetrics and Gynecology | Admitting: Obstetrics and Gynecology

## 2017-09-25 DIAGNOSIS — R928 Other abnormal and inconclusive findings on diagnostic imaging of breast: Secondary | ICD-10-CM

## 2017-11-05 ENCOUNTER — Other Ambulatory Visit: Payer: Self-pay | Admitting: Obstetrics and Gynecology

## 2017-11-05 DIAGNOSIS — Z803 Family history of malignant neoplasm of breast: Secondary | ICD-10-CM

## 2017-11-15 ENCOUNTER — Ambulatory Visit
Admission: RE | Admit: 2017-11-15 | Discharge: 2017-11-15 | Disposition: A | Discharge status: Home / Self Care | Payer: 59 | Source: Ambulatory Visit | Attending: Obstetrics and Gynecology | Admitting: Obstetrics and Gynecology

## 2017-11-15 DIAGNOSIS — Z803 Family history of malignant neoplasm of breast: Secondary | ICD-10-CM

## 2017-11-15 MED ORDER — GADOBENATE DIMEGLUMINE 529 MG/ML IV SOLN
20.0000 mL | Freq: Once | INTRAVENOUS | Status: AC | PRN
  Administered 2017-11-15: 20 mL via INTRAVENOUS

## 2019-09-29 ENCOUNTER — Other Ambulatory Visit: Payer: Self-pay | Admitting: Radiology

## 2019-09-29 DIAGNOSIS — N644 Mastodynia: Secondary | ICD-10-CM

## 2019-09-29 DIAGNOSIS — N6452 Nipple discharge: Secondary | ICD-10-CM

## 2019-10-13 ENCOUNTER — Ambulatory Visit
Admission: RE | Admit: 2019-10-13 | Discharge: 2019-10-13 | Disposition: A | Discharge status: Home / Self Care | Payer: Managed Care, Other (non HMO) | Source: Ambulatory Visit | Attending: Radiology | Admitting: Radiology

## 2019-10-13 ENCOUNTER — Ambulatory Visit
Admission: RE | Admit: 2019-10-13 | Discharge: 2019-10-13 | Disposition: A | Discharge status: Home / Self Care | Payer: 59 | Source: Ambulatory Visit | Attending: Radiology | Admitting: Radiology

## 2019-10-13 ENCOUNTER — Other Ambulatory Visit: Payer: Self-pay | Admitting: Radiology

## 2019-10-13 ENCOUNTER — Other Ambulatory Visit: Payer: Self-pay

## 2019-10-13 DIAGNOSIS — N644 Mastodynia: Secondary | ICD-10-CM

## 2019-10-13 DIAGNOSIS — N6452 Nipple discharge: Secondary | ICD-10-CM

## 2019-10-20 ENCOUNTER — Ambulatory Visit
Admission: RE | Admit: 2019-10-20 | Discharge: 2019-10-20 | Disposition: A | Discharge status: Home / Self Care | Payer: Managed Care, Other (non HMO) | Source: Ambulatory Visit | Attending: Radiology | Admitting: Radiology

## 2019-10-20 ENCOUNTER — Other Ambulatory Visit: Payer: Self-pay

## 2019-10-20 DIAGNOSIS — N644 Mastodynia: Secondary | ICD-10-CM

## 2019-10-20 DIAGNOSIS — N6452 Nipple discharge: Secondary | ICD-10-CM

## 2019-11-09 ENCOUNTER — Ambulatory Visit: Payer: Self-pay | Admitting: Surgery

## 2019-11-09 DIAGNOSIS — D242 Benign neoplasm of left breast: Secondary | ICD-10-CM

## 2019-11-09 NOTE — H&P (Signed)
History of Present Illness Amanda Koch. Amanda Sanger MD; 11/09/2019 10:42 AM) The patient is a 54 year old female who presents with a complaint of nipple discharge. Referred by Rubbie Battiest NP for intraductal papilloma  This is a 54 year old female who presents with recent onset of left nipple discharge. I performed a previous right lumpectomy and nipple duct excision for bloody nipple discharge. This was performed in July 2007. The pathology was benign.  For the last couple of months, the patient has noticed some itching and tenderness below her nipple. She was able to manually express some greenish milky discharge from the lower part of her nipple. This has resolved. However she underwent workup including mammogram and ultrasound with subsequent biopsy. She has a 1.0 x 0.3 x 0.3 cm mass in a single mildly dilated duct at 6:00. 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 She presents now to discuss excision. No family history of breast cancer in first-degree relatives. She does have some breast cancer and more distant relatives.  CLINICAL DATA: 54 year old patient has recently had left nipple discharge, milky to green in color, only when manually expressed. She states that the discharge emanated from one duct in the inferior central aspect of the left nipple. She had some fullness/discomfort in the left breast prior to manually expressing it. The expression of discharge has occurred approximately 2 times.  In 2007 she had central duct excision on the right for right nipple discharge, and results were benign at pathology.  EXAM: DIGITAL DIAGNOSTIC LEFT MAMMOGRAM WITH CAD AND TOMO  ULTRASOUND LEFT BREAST  COMPARISON: Previous exam(s).  ACR Breast Density Category b: There are scattered areas of fibroglandular density.  FINDINGS: No mass, distortion, or suspicious microcalcification is identified in the left breast to suggest  malignancy.  Mammographic images were processed with CAD.  On physical exam, the patient attempted to manually expressed discharge while I was in the ultrasound room with her today. No discharge occurred. Additionally, no discharge occurred during the acquisition of the mammogram images today.  Targeted ultrasound is performed, showing a single mildly dilated duct in the 6 o'clock axis. Within that dilated duct is an intraductal homogeneously hypoechoic oval and gently lobulated mass or less likely debris. Mass measures approximately 1.0 x 0.3 x 0.3 cm. No internal vascular flow is identified, although one vessel is seen immediately adjacent to the intraductal mass.  Ultrasound of the left axilla is negative for lymphadenopathy.  IMPRESSION: Patient with recent history of left nipple discharge a few times with manual expression.  On ultrasound, a probable intraductal mass is identified in the 6 o'clock location, within a mildly dilated duct. 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 Malignancy not excluded.  RECOMMENDATION: Ultrasound-guided core needle biopsy is recommended of the probable intraductal mass 6 o'clock position left breast. This will be scheduled for the patient.  I have discussed the findings and recommendations with the patient. If applicable, a reminder letter will be sent to the patient regarding the next appointment.  BI-RADS CATEGORY 4: Suspicious.   Electronically Signed By: Curlene Dolphin M.D. On: 10/13/2019 09:58  CLINICAL DATA: 54 year old female status post ultrasound-guided biopsy of the left breast.  EXAM: DIAGNOSTIC LEFT MAMMOGRAM POST ULTRASOUND BIOPSY  COMPARISON: Previous exam(s).  FINDINGS: Mammographic images were obtained following ultrasound guided biopsy of the left breast. The biopsy marking clip is in expected position at the site of biopsy.  IMPRESSION: Appropriate positioning of the ribbon  shaped biopsy marking clip at  the site of biopsy in the inferior central left breast.  Final Assessment: Post Procedure Mammograms for Marker Placement   Electronically Signed By: Kristopher Oppenheim M.D. On: 10/20/2019 08:37     Past Surgical History Geni Bers [place], RMA; 11/09/2019 9:32 AM) Breast Biopsy Bilateral. Colon Polyp Removal - Colonoscopy Gallbladder Surgery - Laparoscopic Gastric Bypass Hysterectomy (not due to cancer) - Partial  Diagnostic Studies History Geni Bers [place], RMA; 11/09/2019 9:32 AM) Colonoscopy 1-5 years ago Mammogram within last year Pap Smear 1-5 years ago  Allergies Marguarite Arbour, RMA; 11/09/2019 9:36 AM) No Known Drug Allergies [11/09/2019]: Allergies Reconciled  Medication History [employer], RMA; 11/09/2019 9:37 AM) Iron (Oral) Specific strength unknown - Active. Vitamin B1-B12 (Oral) Specific strength unknown - Active. Vitamin D (Oral) Specific strength unknown - Active. Synthroid (88MCG Tablet, Oral) Active. Multi Vitamin (Oral) Active. Medications Reconciled  Pregnancy / Birth History Geni Bers [place], RMA; 11/09/2019 9:32 AM) Age at menarche 46 years. Age of menopause 51-55 Gravida 2 Irregular periods Length (months) of breastfeeding 7-12 Maternal age 83-35 Para 2  Other Problems Geni Bers [place], RMA; 11/09/2019 9:32 AM) Cholelithiasis Thyroid Disease Umbilical Hernia Repair     Review of Systems Geni Bers [place] RMA; 11/09/2019 9:32 AM) General Not Present- Appetite Loss, Chills, Fatigue, Fever, Night Sweats, Weight Gain and Weight Loss. Skin Not Present- Change in Wart/Mole, Dryness, Hives, Jaundice, New Lesions, Non-Healing Wounds, Rash and Ulcer. HEENT Not Present- Earache, Hearing Loss, Hoarseness, Nose Bleed, Oral Ulcers, Ringing in the Ears, Seasonal Allergies, Sinus Pain, Sore Throat, Visual Disturbances, Wears glasses/contact lenses and Yellow Eyes. Respiratory Not  Present- Bloody sputum, Chronic Cough, Difficulty Breathing, Snoring and Wheezing. Breast Present- Nipple Discharge. Not Present- Breast Mass, Breast Pain and Skin Changes. Cardiovascular Not Present- Chest Pain, Difficulty Breathing Lying Down, Leg Cramps, Palpitations, Rapid Heart Rate, Shortness of Breath and Swelling of Extremities. Gastrointestinal Not Present- Abdominal Pain, Bloating, Bloody Stool, Change in Bowel Habits, Chronic diarrhea, Constipation, Difficulty Swallowing, Excessive gas, Gets full quickly at meals, Hemorrhoids, Indigestion, Nausea, Rectal Pain and Vomiting. Female Genitourinary Not Present- Frequency, Nocturia, Painful Urination, Pelvic Pain and Urgency. Musculoskeletal Not Present- Back Pain, Joint Pain, Joint Stiffness, Muscle Pain, Muscle Weakness and Swelling of Extremities. Neurological Not Present- Decreased Memory, Fainting, Headaches, Numbness, Seizures, Tingling, Tremor, Trouble walking and Weakness. Psychiatric Not Present- Anxiety, Bipolar, Change in Sleep Pattern, Depression, Fearful and Frequent crying. Endocrine Present- Hot flashes. Not Present- Cold Intolerance, Excessive Hunger, Hair Changes, Heat Intolerance and New Diabetes. Hematology Not Present- Blood Thinners, Easy Bruising, Excessive bleeding, Gland problems, HIV and Persistent Infections.  Vitals Geni Bers [place] RMA; 11/09/2019 9:37 AM) 11/09/2019 9:37 AM Weight: 239.4 lb Height: 66.75in Body Surface Area: 2.18 m Body Mass Index: 37.78 kg/m  Temp.: 98.11F(Temporal)  Pulse: 82 (Regular)  P.OX: 96% (Room air) BP: 118/76(Sitting, Right Arm, Standard)        Physical Exam Rodman Key K. Alwilda Gilland MD; 11/09/2019 10:43 AM)  The physical exam findings are as follows: Note:Constitutional: WDWN in NAD, conversant, no obvious deformities; resting comfortably Eyes: Pupils equal, round; sclera anicteric; moist conjunctiva; no lid lag HENT: Oral mucosa moist; good dentition Neck: No  masses palpated, trachea midline; no thyromegaly Lungs: CTA bilaterally; normal respiratory effort Breasts: symmetric, well-healed right circumareolar incision; no dominant masses on either side; no axillary lymphadenopathy; no nipple discharge CV: Regular rate and rhythm; no murmurs; extremities well-perfused with no edema Abd: +bowel sounds, soft, non-tender, no palpable organomegaly; no palpable hernias Musc: Normal gait; no apparent clubbing or cyanosis in extremities Lymphatic: No palpable cervical  or axillary lymphadenopathy Skin: Warm, dry; no sign of jaundice Psychiatric - alert and oriented x 4; calm mood and affect    Assessment & Plan Rodman Key K. Brek Reece MD; 11/09/2019 9:45 AM)  Madelyn Flavors PAPILLOMA OF BREAST, LEFT (D24.2)   DISCHARGE FROM LEFT NIPPLE (N64.52)  Current Plans Schedule for Surgery - Left radioactive seed localized lumpectomy. The surgical procedure has been discussed with the patient. Potential risks, benefits, alternative treatments, and expected outcomes have been explained. All of the patient's questions at this time have been answered. The likelihood of reaching the patient's treatment goal is good. The patient understand the proposed surgical procedure and wishes to proceed.   Amanda Koch. Georgette Dover, MD, [facility]  General/ Trauma Surgery   11/09/2019 10:44 AM

## 2019-11-19 ENCOUNTER — Other Ambulatory Visit: Payer: Self-pay | Admitting: Surgery

## 2019-11-19 DIAGNOSIS — D242 Benign neoplasm of left breast: Secondary | ICD-10-CM

## 2019-12-21 ENCOUNTER — Encounter (HOSPITAL_BASED_OUTPATIENT_CLINIC_OR_DEPARTMENT_OTHER): Payer: Self-pay | Admitting: Surgery

## 2019-12-21 ENCOUNTER — Other Ambulatory Visit: Payer: Self-pay

## 2019-12-25 ENCOUNTER — Other Ambulatory Visit (HOSPITAL_COMMUNITY)
Admission: RE | Admit: 2019-12-25 | Discharge: 2019-12-25 | Disposition: A | Discharge status: Home / Self Care | Payer: Managed Care, Other (non HMO) | Source: Ambulatory Visit | Attending: Surgery | Admitting: Surgery

## 2019-12-25 DIAGNOSIS — Z20822 Contact with and (suspected) exposure to covid-19: Secondary | ICD-10-CM | POA: Diagnosis not present

## 2019-12-25 DIAGNOSIS — Z01812 Encounter for preprocedural laboratory examination: Secondary | ICD-10-CM | POA: Diagnosis not present

## 2019-12-25 LAB — SARS CORONAVIRUS 2 (TAT 6-24 HRS): SARS Coronavirus 2: NEGATIVE

## 2019-12-28 ENCOUNTER — Ambulatory Visit
Admission: RE | Admit: 2019-12-28 | Discharge: 2019-12-28 | Disposition: A | Discharge status: Home / Self Care | Payer: Managed Care, Other (non HMO) | Source: Ambulatory Visit | Attending: Surgery | Admitting: Surgery

## 2019-12-28 ENCOUNTER — Other Ambulatory Visit: Payer: Self-pay

## 2019-12-28 DIAGNOSIS — D242 Benign neoplasm of left breast: Secondary | ICD-10-CM

## 2019-12-29 ENCOUNTER — Encounter (HOSPITAL_BASED_OUTPATIENT_CLINIC_OR_DEPARTMENT_OTHER): Payer: Self-pay | Admitting: Surgery

## 2019-12-29 ENCOUNTER — Ambulatory Visit
Admission: RE | Admit: 2019-12-29 | Discharge: 2019-12-29 | Disposition: A | Discharge status: Home / Self Care | Payer: Managed Care, Other (non HMO) | Source: Ambulatory Visit | Attending: Surgery | Admitting: Surgery

## 2019-12-29 ENCOUNTER — Other Ambulatory Visit (HOSPITAL_COMMUNITY)
Admission: RE | Admit: 2019-12-29 | Discharge: 2019-12-29 | Disposition: A | Discharge status: Home / Self Care | Payer: Managed Care, Other (non HMO) | Source: Ambulatory Visit | Attending: Surgery | Admitting: Surgery

## 2019-12-29 ENCOUNTER — Other Ambulatory Visit (HOSPITAL_COMMUNITY): Payer: Self-pay

## 2019-12-29 DIAGNOSIS — Z20822 Contact with and (suspected) exposure to covid-19: Secondary | ICD-10-CM | POA: Diagnosis not present

## 2019-12-29 DIAGNOSIS — Z01812 Encounter for preprocedural laboratory examination: Secondary | ICD-10-CM | POA: Diagnosis present

## 2019-12-29 DIAGNOSIS — D242 Benign neoplasm of left breast: Secondary | ICD-10-CM

## 2019-12-29 LAB — SARS CORONAVIRUS 2 (TAT 6-24 HRS): SARS Coronavirus 2: NEGATIVE

## 2019-12-29 MED ORDER — MIDAZOLAM HCL 2 MG/2ML IJ SOLN
INTRAMUSCULAR | Status: AC
  Filled 2019-12-29: qty 2

## 2019-12-29 MED ORDER — LIDOCAINE 2% (20 MG/ML) 5 ML SYRINGE
INTRAMUSCULAR | Status: AC
  Filled 2019-12-29: qty 5

## 2019-12-29 MED ORDER — FENTANYL CITRATE (PF) 100 MCG/2ML IJ SOLN
INTRAMUSCULAR | Status: AC
  Filled 2019-12-29: qty 2

## 2019-12-29 MED ORDER — DEXAMETHASONE SODIUM PHOSPHATE 10 MG/ML IJ SOLN
INTRAMUSCULAR | Status: AC
  Filled 2019-12-29: qty 1

## 2019-12-29 MED ORDER — ONDANSETRON HCL 4 MG/2ML IJ SOLN
INTRAMUSCULAR | Status: AC
  Filled 2019-12-29: qty 2

## 2019-12-30 ENCOUNTER — Ambulatory Visit (HOSPITAL_BASED_OUTPATIENT_CLINIC_OR_DEPARTMENT_OTHER)
Admission: RE | Admit: 2019-12-30 | Discharge: 2019-12-30 | Disposition: A | Discharge status: Home / Self Care | Payer: Managed Care, Other (non HMO) | Attending: Surgery | Admitting: Surgery

## 2019-12-30 ENCOUNTER — Encounter (HOSPITAL_BASED_OUTPATIENT_CLINIC_OR_DEPARTMENT_OTHER)
Admission: RE | Disposition: A | Discharge status: Home / Self Care | Payer: Self-pay | Source: Home / Self Care | Attending: Surgery

## 2019-12-30 ENCOUNTER — Encounter (HOSPITAL_BASED_OUTPATIENT_CLINIC_OR_DEPARTMENT_OTHER): Payer: Self-pay | Admitting: Surgery

## 2019-12-30 ENCOUNTER — Ambulatory Visit
Admission: RE | Admit: 2019-12-30 | Discharge: 2019-12-30 | Disposition: A | Discharge status: Home / Self Care | Payer: Managed Care, Other (non HMO) | Source: Ambulatory Visit | Attending: Surgery | Admitting: Surgery

## 2019-12-30 ENCOUNTER — Ambulatory Visit (HOSPITAL_BASED_OUTPATIENT_CLINIC_OR_DEPARTMENT_OTHER): Payer: Managed Care, Other (non HMO) | Admitting: Anesthesiology

## 2019-12-30 ENCOUNTER — Other Ambulatory Visit: Payer: Self-pay

## 2019-12-30 DIAGNOSIS — F32A Depression, unspecified: Secondary | ICD-10-CM | POA: Diagnosis not present

## 2019-12-30 DIAGNOSIS — E039 Hypothyroidism, unspecified: Secondary | ICD-10-CM | POA: Diagnosis not present

## 2019-12-30 DIAGNOSIS — N6452 Nipple discharge: Secondary | ICD-10-CM | POA: Insufficient documentation

## 2019-12-30 DIAGNOSIS — D242 Benign neoplasm of left breast: Secondary | ICD-10-CM | POA: Insufficient documentation

## 2019-12-30 DIAGNOSIS — E669 Obesity, unspecified: Secondary | ICD-10-CM | POA: Diagnosis not present

## 2019-12-30 DIAGNOSIS — Z6837 Body mass index (BMI) 37.0-37.9, adult: Secondary | ICD-10-CM | POA: Diagnosis not present

## 2019-12-30 DIAGNOSIS — N62 Hypertrophy of breast: Secondary | ICD-10-CM | POA: Insufficient documentation

## 2019-12-30 HISTORY — PX: BREAST LUMPECTOMY WITH RADIOACTIVE SEED LOCALIZATION: SHX6424

## 2019-12-30 SURGERY — BREAST LUMPECTOMY WITH RADIOACTIVE SEED LOCALIZATION
Anesthesia: General | Site: Breast | Laterality: Left

## 2019-12-30 MED ORDER — DEXAMETHASONE SODIUM PHOSPHATE 10 MG/ML IJ SOLN
INTRAMUSCULAR | Status: DC | PRN
  Administered 2019-12-30: 10 mg via INTRAVENOUS

## 2019-12-30 MED ORDER — OXYCODONE HCL 5 MG/5ML PO SOLN: 5.0000 mg | Freq: Once | ORAL | Status: DC | PRN

## 2019-12-30 MED ORDER — GABAPENTIN 300 MG PO CAPS
ORAL_CAPSULE | ORAL | Status: AC
  Filled 2019-12-30: qty 1

## 2019-12-30 MED ORDER — LIDOCAINE 2% (20 MG/ML) 5 ML SYRINGE
INTRAMUSCULAR | Status: DC | PRN
  Administered 2019-12-30: 100 mg via INTRAVENOUS

## 2019-12-30 MED ORDER — ONDANSETRON HCL 4 MG/2ML IJ SOLN: 4.0000 mg | Freq: Once | INTRAMUSCULAR | Status: DC | PRN

## 2019-12-30 MED ORDER — ACETAMINOPHEN 160 MG/5ML PO SOLN: 325.0000 mg | ORAL | Status: DC | PRN

## 2019-12-30 MED ORDER — LACTATED RINGERS IV SOLN: INTRAVENOUS | Status: DC

## 2019-12-30 MED ORDER — MEPERIDINE HCL 25 MG/ML IJ SOLN: 6.2500 mg | INTRAMUSCULAR | Status: DC | PRN

## 2019-12-30 MED ORDER — GABAPENTIN 300 MG PO CAPS
300.0000 mg | ORAL_CAPSULE | ORAL | Status: AC
  Administered 2019-12-30: 300 mg via ORAL

## 2019-12-30 MED ORDER — ACETAMINOPHEN 500 MG PO TABS
1000.0000 mg | ORAL_TABLET | ORAL | Status: AC
  Administered 2019-12-30: 1000 mg via ORAL

## 2019-12-30 MED ORDER — CEFAZOLIN SODIUM-DEXTROSE 2-4 GM/100ML-% IV SOLN
INTRAVENOUS | Status: AC
  Filled 2019-12-30: qty 100

## 2019-12-30 MED ORDER — ACETAMINOPHEN 500 MG PO TABS
ORAL_TABLET | ORAL | Status: AC
  Filled 2019-12-30: qty 2

## 2019-12-30 MED ORDER — ONDANSETRON HCL 4 MG/2ML IJ SOLN
INTRAMUSCULAR | Status: DC | PRN
  Administered 2019-12-30: 4 mg via INTRAVENOUS

## 2019-12-30 MED ORDER — CHLORHEXIDINE GLUCONATE CLOTH 2 % EX PADS: 6.0000 | MEDICATED_PAD | Freq: Once | CUTANEOUS | Status: DC

## 2019-12-30 MED ORDER — BUPIVACAINE-EPINEPHRINE 0.25% -1:200000 IJ SOLN
INTRAMUSCULAR | Status: DC | PRN
  Administered 2019-12-30: 10 mL

## 2019-12-30 MED ORDER — KETOROLAC TROMETHAMINE 30 MG/ML IJ SOLN
INTRAMUSCULAR | Status: DC | PRN
  Administered 2019-12-30: 30 mg via INTRAVENOUS

## 2019-12-30 MED ORDER — OXYCODONE HCL 5 MG PO TABS: 5.0000 mg | ORAL_TABLET | Freq: Once | ORAL | Status: DC | PRN

## 2019-12-30 MED ORDER — CEFAZOLIN SODIUM-DEXTROSE 2-4 GM/100ML-% IV SOLN
2.0000 g | INTRAVENOUS | Status: AC
  Administered 2019-12-30: 2 g via INTRAVENOUS

## 2019-12-30 MED ORDER — 0.9 % SODIUM CHLORIDE (POUR BTL) OPTIME
TOPICAL | Status: DC | PRN
  Administered 2019-12-30: 1000 mL

## 2019-12-30 MED ORDER — METHYLENE BLUE 0.5 % INJ SOLN
INTRAVENOUS | Status: AC
  Filled 2019-12-30: qty 10

## 2019-12-30 MED ORDER — SODIUM CHLORIDE (PF) 0.9 % IJ SOLN
INTRAMUSCULAR | Status: AC
  Filled 2019-12-30: qty 10

## 2019-12-30 MED ORDER — PROPOFOL 10 MG/ML IV BOLUS
INTRAVENOUS | Status: DC | PRN
  Administered 2019-12-30: 200 mg via INTRAVENOUS

## 2019-12-30 MED ORDER — HYDROCODONE-ACETAMINOPHEN 5-325 MG PO TABS: 1.0000 | ORAL_TABLET | Freq: Four times a day (QID) | ORAL | 0 refills | Status: AC | PRN

## 2019-12-30 MED ORDER — FENTANYL CITRATE (PF) 100 MCG/2ML IJ SOLN
INTRAMUSCULAR | Status: DC | PRN
Start: 2019-12-30 — End: 2019-12-30
  Administered 2019-12-30: 50 ug via INTRAVENOUS
  Administered 2019-12-30: 25 ug via INTRAVENOUS

## 2019-12-30 MED ORDER — ACETAMINOPHEN 325 MG PO TABS: 325.0000 mg | ORAL_TABLET | ORAL | Status: DC | PRN

## 2019-12-30 MED ORDER — FENTANYL CITRATE (PF) 100 MCG/2ML IJ SOLN: 25.0000 ug | INTRAMUSCULAR | Status: DC | PRN

## 2019-12-30 SURGICAL SUPPLY — 51 items
APL PRP STRL LF DISP 70% ISPRP (MISCELLANEOUS) ×1
APPLIER CLIP 9.375 MED OPEN (MISCELLANEOUS) ×3
BENZOIN TINCTURE PRP APPL 2/3 (GAUZE/BANDAGES/DRESSINGS) ×3 IMPLANT
BLADE HEX COATED 2.75 (ELECTRODE) ×3 IMPLANT
BLADE SURG 15 STRL LF DISP TIS (BLADE) ×1 IMPLANT
BLADE SURG 15 STRL SS (BLADE) ×3
CANISTER SUC SOCK COL 7IN (MISCELLANEOUS) IMPLANT
CANISTER SUCT 1200ML W/VALVE (MISCELLANEOUS) IMPLANT
CHLORAPREP W/TINT 26 (MISCELLANEOUS) ×3 IMPLANT
CLIP APPLIE 9.375 MED OPEN (MISCELLANEOUS) ×1 IMPLANT
CLOSURE WOUND 1/2 X4 (GAUZE/BANDAGES/DRESSINGS) ×1
COVER BACK TABLE 60X90IN (DRAPES) ×3 IMPLANT
COVER MAYO STAND STRL (DRAPES) ×3 IMPLANT
COVER PROBE W GEL 5X96 (DRAPES) ×3 IMPLANT
COVER WAND RF STERILE (DRAPES) IMPLANT
DECANTER SPIKE VIAL GLASS SM (MISCELLANEOUS) IMPLANT
DRAPE LAPAROTOMY 100X72 PEDS (DRAPES) ×3 IMPLANT
DRAPE UTILITY XL STRL (DRAPES) ×3 IMPLANT
DRSG TEGADERM 4X4.75 (GAUZE/BANDAGES/DRESSINGS) ×3 IMPLANT
ELECT REM PT RETURN 9FT ADLT (ELECTROSURGICAL) ×3
ELECTRODE REM PT RTRN 9FT ADLT (ELECTROSURGICAL) ×1 IMPLANT
GAUZE SPONGE 4X4 12PLY STRL LF (GAUZE/BANDAGES/DRESSINGS) ×3 IMPLANT
GLOVE BIO SURGEON STRL SZ7 (GLOVE) ×3 IMPLANT
GLOVE BIOGEL PI IND STRL 7.5 (GLOVE) ×1 IMPLANT
GLOVE BIOGEL PI INDICATOR 7.5 (GLOVE) ×2
GOWN STRL REUS W/ TWL LRG LVL3 (GOWN DISPOSABLE) ×2 IMPLANT
GOWN STRL REUS W/TWL LRG LVL3 (GOWN DISPOSABLE) ×6
ILLUMINATOR WAVEGUIDE N/F (MISCELLANEOUS) IMPLANT
KIT MARKER MARGIN INK (KITS) ×3 IMPLANT
LIGHT WAVEGUIDE WIDE FLAT (MISCELLANEOUS) IMPLANT
NEEDLE HYPO 25X1 1.5 SAFETY (NEEDLE) ×3 IMPLANT
NS IRRIG 1000ML POUR BTL (IV SOLUTION) ×3 IMPLANT
PACK BASIN DAY SURGERY FS (CUSTOM PROCEDURE TRAY) ×3 IMPLANT
PENCIL SMOKE EVACUATOR (MISCELLANEOUS) ×3 IMPLANT
SLEEVE SCD COMPRESS KNEE MED (MISCELLANEOUS) ×3 IMPLANT
SPONGE GAUZE 2X2 8PLY STER LF (GAUZE/BANDAGES/DRESSINGS)
SPONGE GAUZE 2X2 8PLY STRL LF (GAUZE/BANDAGES/DRESSINGS) IMPLANT
SPONGE LAP 18X18 RF (DISPOSABLE) IMPLANT
SPONGE LAP 4X18 RFD (DISPOSABLE) ×3 IMPLANT
STRIP CLOSURE SKIN 1/2X4 (GAUZE/BANDAGES/DRESSINGS) ×2 IMPLANT
SUT MON AB 4-0 PC3 18 (SUTURE) ×3 IMPLANT
SUT SILK 2 0 SH (SUTURE) IMPLANT
SUT VIC AB 3-0 SH 27 (SUTURE) ×3
SUT VIC AB 3-0 SH 27X BRD (SUTURE) ×1 IMPLANT
SYR BULB EAR ULCER 3OZ GRN STR (SYRINGE) IMPLANT
SYR CONTROL 10ML LL (SYRINGE) ×3 IMPLANT
TOWEL GREEN STERILE FF (TOWEL DISPOSABLE) ×3 IMPLANT
TRAY FAXITRON CT DISP (TRAY / TRAY PROCEDURE) ×3 IMPLANT
TUBE CONNECTING 20'X1/4 (TUBING)
TUBE CONNECTING 20X1/4 (TUBING) IMPLANT
YANKAUER SUCT BULB TIP NO VENT (SUCTIONS) IMPLANT

## 2019-12-30 NOTE — Op Note (Signed)
Pre-op Diagnosis:  Left nipple discharge/ intraductal papilloma Post-op Diagnosis: same Procedure:  Left radioactive seed localized lumpectomy Surgeon:  Taressa Rauh K. Anesthesia:  GEN - LMA Indications:  This is a 54 year old female who presents with recent onset of left nipple discharge. I performed a previous right lumpectomy and nipple duct excision for bloody nipple discharge. This was performed in July 2007. The pathology was benign.  For the last couple of months, the patient has noticed some itching and tenderness below her nipple. She was able to manually express some greenish milky discharge from the lower part of her nipple. This has resolved. However she underwent workup including mammogram and ultrasound with subsequent biopsy. She has a 1.0 x 0.3 x 0.3 cm mass in a single mildly dilated duct at 6:00. 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 She presents now to discuss excision.  A radioactive seed was placed on 12/28/19 by Radiology.    Description of procedure: The patient is brought to the operating room placed in supine position on the operating room table. After an adequate level of general anesthesia was obtained, her left breast was prepped with ChloraPrep and draped in sterile fashion. A timeout was taken to ensure the proper patient and proper procedure. We interrogated the breast with the neoprobe. We made a circumareolar incision around the lower side of the nipple after infiltrating with 0.25% Marcaine. Dissection was carried down in the breast tissue with cautery. We used the neoprobe to guide [place] towards the radioactive seed. We excised an area of tissue around the radioactive seed 1.5 cm in diameter. The specimen was removed and was oriented with a paint kit. Specimen mammogram showed the radioactive seed as well as the biopsy clip within the specimen. This was sent for pathologic examination. There is no residual radioactivity  within the biopsy cavity. We inspected carefully for hemostasis. The wound was thoroughly irrigated. The wound was closed with a deep layer of 3-0 Vicryl and a subcuticular layer of 4-0 Monocryl. Benzoin Steri-Strips were applied. The patient was then extubated and brought to the recovery room in stable condition. All sponge, instrument, and needle counts are correct.  Imogene Burn. Georgette Dover, MD, [facility] Surgery  General/ Trauma Surgery  12/30/2019 2:04 PM

## 2019-12-30 NOTE — H&P (Signed)
History of Present Illness  The patient is a 54 year old female who presents with a complaint of nipple discharge.  Referred by Rubbie Battiest NP for intraductal papilloma  This is a 54 year old female who presents with recent onset of left nipple discharge. I performed a previous right lumpectomy and nipple duct excision for bloody nipple discharge. This was performed in July 2007. The pathology was benign.  For the last couple of months, the patient has noticed some itching and tenderness below her nipple. She was able to manually express some greenish milky discharge from the lower part of her nipple. This has resolved. However she underwent workup including mammogram and ultrasound with subsequent biopsy. She has a 1.0 x 0.3 x 0.3 cm mass in a single mildly dilated duct at 6:00. 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 She presents now to discuss excision. No family history of breast cancer in first-degree relatives. She does have some breast cancer and more distant relatives.  CLINICAL DATA: 54 year old patient has recently had left nipple discharge, milky to green in color, only when manually expressed. She states that the discharge emanated from one duct in the inferior central aspect of the left nipple. She had some fullness/discomfort in the left breast prior to manually expressing it. The expression of discharge has occurred approximately 2 times.  In 2007 she had central duct excision on the right for right nipple discharge, and results were benign at pathology.  EXAM: DIGITAL DIAGNOSTIC LEFT MAMMOGRAM WITH CAD AND TOMO  ULTRASOUND LEFT BREAST  COMPARISON: Previous exam(s).  ACR Breast Density Category b: There are scattered areas of fibroglandular density.  FINDINGS: No mass, distortion, or suspicious microcalcification is identified in the left breast to suggest malignancy.  Mammographic images were  processed with CAD.  On physical exam, the patient attempted to manually expressed discharge while I was in the ultrasound room with her today. No discharge occurred. Additionally, no discharge occurred during the acquisition of the mammogram images today.  Targeted ultrasound is performed, showing a single mildly dilated duct in the 6 o'clock axis. Within that dilated duct is an intraductal homogeneously hypoechoic oval and gently lobulated mass or less likely debris. Mass measures approximately 1.0 x 0.3 x 0.3 cm. No internal vascular flow is identified, although one vessel is seen immediately adjacent to the intraductal mass.  Ultrasound of the left axilla is negative for lymphadenopathy.  IMPRESSION: Patient with recent history of left nipple discharge a few times with manual expression.  On ultrasound, a probable intraductal mass is identified in the 6 o'clock location, within a mildly dilated duct. 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 Malignancy not excluded.  RECOMMENDATION: Ultrasound-guided core needle biopsy is recommended of the probable intraductal mass 6 o'clock position left breast. This will be scheduled for the patient.  I have discussed the findings and recommendations with the patient. If applicable, a reminder letter will be sent to the patient regarding the next appointment.  BI-RADS CATEGORY 4: Suspicious.   Electronically Signed By: Curlene Dolphin M.D. On: 10/13/2019 09:58  CLINICAL DATA: 54 year old female status post ultrasound-guided biopsy of the left breast.  EXAM: DIAGNOSTIC LEFT MAMMOGRAM POST ULTRASOUND BIOPSY  COMPARISON: Previous exam(s).  FINDINGS: Mammographic images were obtained following ultrasound guided biopsy of the left breast. The biopsy marking clip is in expected position at the site of biopsy.  IMPRESSION: Appropriate positioning of the ribbon shaped biopsy marking  clip at the site of biopsy in  the inferior central left breast.  Final Assessment: Post Procedure Mammograms for Marker Placement   Electronically Signed By: Kristopher Oppenheim M.D. On: 10/20/2019 08:37     Past Surgical History  Breast Biopsy Bilateral. Colon Polyp Removal - Colonoscopy Gallbladder Surgery - Laparoscopic Gastric Bypass Hysterectomy (not due to cancer) - Partial  Diagnostic Studies History  Colonoscopy 1-5 years ago Mammogram within last year Pap Smear 1-5 years ago  Allergies  No Known Drug Allergies  Allergies Reconciled  Medication History  Iron (Oral) Specific strength unknown - Active. Vitamin B1-B12 (Oral) Specific strength unknown - Active. Vitamin D (Oral) Specific strength unknown - Active. Synthroid (88MCG Tablet, Oral) Active. Multi Vitamin (Oral) Active. Medications Reconciled  Pregnancy / Birth History  Age at menarche 50 years. Age of menopause 61-55 Gravida 2 Irregular periods Length (months) of breastfeeding 7-12 Maternal age 17-35 Para 2  Other Problems Cholelithiasis Thyroid Disease Umbilical Hernia Repair     Review of Systems  General Not Present- Appetite Loss, Chills, Fatigue, Fever, Night Sweats, Weight Gain and Weight Loss. Skin Not Present- Change in Wart/Mole, Dryness, Hives, Jaundice, New Lesions, Non-Healing Wounds, Rash and Ulcer. HEENT Not Present- Earache, Hearing Loss, Hoarseness, Nose Bleed, Oral Ulcers, Ringing in the Ears, Seasonal Allergies, Sinus Pain, Sore Throat, Visual Disturbances, Wears glasses/contact lenses and Yellow Eyes. Respiratory Not Present- Bloody sputum, Chronic Cough, Difficulty Breathing, Snoring and Wheezing. Breast Present- Nipple Discharge. Not Present- Breast Mass, Breast Pain and Skin Changes. Cardiovascular Not Present- Chest Pain, Difficulty Breathing Lying Down, Leg Cramps, Palpitations, Rapid Heart Rate, Shortness of Breath and Swelling  of Extremities. Gastrointestinal Not Present- Abdominal Pain, Bloating, Bloody Stool, Change in Bowel Habits, Chronic diarrhea, Constipation, Difficulty Swallowing, Excessive gas, Gets full quickly at meals, Hemorrhoids, Indigestion, Nausea, Rectal Pain and Vomiting. Female Genitourinary Not Present- Frequency, Nocturia, Painful Urination, Pelvic Pain and Urgency. Musculoskeletal Not Present- Back Pain, Joint Pain, Joint Stiffness, Muscle Pain, Muscle Weakness and Swelling of Extremities. Neurological Not Present- Decreased Memory, Fainting, Headaches, Numbness, Seizures, Tingling, Tremor, Trouble walking and Weakness. Psychiatric Not Present- Anxiety, Bipolar, Change in Sleep Pattern, Depression, Fearful and Frequent crying. Endocrine Present- Hot flashes. Not Present- Cold Intolerance, Excessive Hunger, Hair Changes, Heat Intolerance and New Diabetes. Hematology Not Present- Blood Thinners, Easy Bruising, Excessive bleeding, Gland problems, HIV and Persistent Infections.  Vitals  Weight: 239.4 lb Height: 66.75in Body Surface Area: 2.18 m Body Mass Index: 37.78 kg/m  Temp.: 98.73F(Temporal)  Pulse: 82 (Regular)  P.OX: 96% (Room air) BP: 118/76(Sitting, Right Arm, Standard)        Physical Exam   The physical exam findings are as follows: Note:Constitutional: WDWN in NAD, conversant, no obvious deformities; resting comfortably Eyes: Pupils equal, round; sclera anicteric; moist conjunctiva; no lid lag HENT: Oral mucosa moist; good dentition Neck: No masses palpated, trachea midline; no thyromegaly Lungs: CTA bilaterally; normal respiratory effort Breasts: symmetric, well-healed right circumareolar incision; no dominant masses on either side; no axillary lymphadenopathy; no nipple discharge CV: Regular rate and rhythm; no murmurs; extremities well-perfused with no edema Abd: +bowel sounds, soft, non-tender, no palpable organomegaly; no palpable hernias Musc:  Normal gait; no apparent clubbing or cyanosis in extremities Lymphatic: No palpable cervical or axillary lymphadenopathy Skin: Warm, dry; no sign of jaundice Psychiatric - alert and oriented x 4; calm mood and affect    Assessment & Plan   INTRADUCTAL PAPILLOMA OF BREAST, LEFT (D24.2)   DISCHARGE FROM LEFT NIPPLE (N64.52)  Current Plans Schedule for Surgery - Left radioactive seed localized lumpectomy. The  surgical procedure has been discussed with the patient. Potential risks, benefits, alternative treatments, and expected outcomes have been explained. All of the patient's questions at this time have been answered. The likelihood of reaching the patient's treatment goal is good. The patient understand the proposed surgical procedure and wishes to proceed.   Imogene Burn. Georgette Dover, MD, [facility]  General/ Trauma Surgery   12/30/2019 12:25 PM

## 2019-12-30 NOTE — Discharge Instructions (Addendum)
[place] Office Phone Number [phone]  BREAST BIOPSY/ PARTIAL MASTECTOMY: POST OP INSTRUCTIONS  Always review your discharge instruction sheet given to you by the facility where your surgery was performed.  IF YOU HAVE DISABILITY OR FAMILY LEAVE FORMS, YOU MUST BRING THEM TO THE OFFICE FOR PROCESSING.  DO NOT GIVE THEM TO YOUR DOCTOR.  1. A prescription for pain medication may be given to you upon discharge.  Take your pain medication as prescribed, if needed.  If narcotic pain medicine is not needed, then you may take acetaminophen (Tylenol) or ibuprofen (Advil) as needed. No ibuprofen until after 8pm (similar med given at 2pm) 2. Take your usually prescribed medications unless otherwise directed 3. If you need a refill on your pain medication, please contact your pharmacy.  They will contact our office to request authorization.  Prescriptions will not be filled after 5pm or on week-ends. 4. You should eat very light the first 24 hours after surgery, such as soup, crackers, pudding, etc.  Resume your normal diet the day after surgery. 5. Most patients will experience some swelling and bruising in the breast.  Ice packs and a good support bra will help.  Swelling and bruising can take several days to resolve.  6. It is common to experience some constipation if taking pain medication after surgery.  Increasing fluid intake and taking a stool softener will usually help or prevent this problem from occurring.  A mild laxative (Milk of Magnesia or Miralax) should be taken according to package directions if there are no bowel movements after 48 hours. 7. Unless discharge instructions indicate otherwise, you may remove your bandages 24-48 hours after surgery, and you may shower at that time.  You may have steri-strips (small skin tapes) in place directly over the incision.  These strips should be left on the skin for 7-10 days.  If your surgeon used skin glue on the incision, you may  shower in 24 hours.  The glue will flake off over the next 2-3 weeks.  Any sutures or staples will be removed at the office during your follow-up visit. 8. ACTIVITIES:  You may resume regular daily activities (gradually increasing) beginning the next day.  Wearing a good support bra or sports bra minimizes pain and swelling.  You may have sexual intercourse when it is comfortable. a. You may drive when you no longer are taking prescription pain medication, you can comfortably wear a seatbelt, and you can safely maneuver your car and apply brakes. b. RETURN TO WORK:  ______________________________________________________________________________________ 9. You should see your doctor in the office for a follow-up appointment approximately two weeks after your surgery.  Your doctor's nurse will typically make your follow-up appointment when she calls you with your pathology report.  Expect your pathology report 2-3 business days after your surgery.  You may call to check if you do not hear from [place] after three days. 10. OTHER INSTRUCTIONS: _______________________________________________________________________________________________ _____________________________________________________________________________________________________________________________________ _____________________________________________________________________________________________________________________________________ _____________________________________________________________________________________________________________________________________  WHEN TO CALL YOUR DOCTOR: 1. Fever over 101.0 2. Nausea and/or vomiting. 3. Extreme swelling or bruising. 4. Continued bleeding from incision. 5. Increased pain, redness, or drainage from the incision.  The clinic staff is available to answer your questions during regular business hours.  Please don't hesitate to call and ask to speak to one of the nurses for clinical concerns.   If you have a medical emergency, go to the nearest emergency room or call 911.  A surgeon from [facility] is always on call at  the hospital.  For further questions, please visit [email or web address]    Post Anesthesia Home Care Instructions  Activity: Get plenty of rest for the remainder of the day. A responsible individual must stay with you for 24 hours following the procedure.  For the next 24 hours, DO NOT: -Drive a car -Paediatric nurse -Drink alcoholic beverages -Take any medication unless instructed by your physician -Make any legal decisions or sign important papers.  Meals: Start with liquid foods such as gelatin or soup. Progress to regular foods as tolerated. Avoid greasy, spicy, heavy foods. If nausea and/or vomiting occur, drink only clear liquids until the nausea and/or vomiting subsides. Call your physician if vomiting continues.  Special Instructions/Symptoms: Your throat may feel dry or sore from the anesthesia or the breathing tube placed in your throat during surgery. If this causes discomfort, gargle with warm salt water. The discomfort should disappear within 24 hours.  If you had a scopolamine patch placed behind your ear for the management of post- operative nausea and/or vomiting:  1. The medication in the patch is effective for 72 hours, after which it should be removed.  Wrap patch in a tissue and discard in the trash. Wash hands thoroughly with soap and water. 2. You may remove the patch earlier than 72 hours if you experience unpleasant side effects which may include dry mouth, dizziness or visual disturbances. 3. Avoid touching the patch. Wash your hands with soap and water after contact with the patch.

## 2019-12-30 NOTE — Anesthesia Procedure Notes (Signed)
Procedure Name: LMA Insertion Date/Time: 12/30/2019 1:31 PM Performed by: [place] Brenyn Petrey C, CRNA Pre-anesthesia Checklist: Patient identified, Emergency Drugs available, Suction available and Patient being monitored Patient Re-evaluated:Patient Re-evaluated prior to induction Oxygen Delivery Method: Circle system utilized Preoxygenation: Pre-oxygenation with 100% oxygen Induction Type: IV induction Ventilation: Mask ventilation without difficulty LMA: LMA inserted LMA Size: 4.0 Number of attempts: 1 Airway Equipment and Method: Bite block Placement Confirmation: positive ETCO2 Tube secured with: Tape Dental Injury: Teeth and Oropharynx as per pre-operative assessment

## 2019-12-30 NOTE — Anesthesia Postprocedure Evaluation (Signed)
Anesthesia Post Note  Patient: Amanda Koch  Procedure(s) Performed: LEFT BREAST LUMPECTOMY WITH RADIOACTIVE SEED LOCALIZATION (Left Breast)     Patient location during evaluation: PACU Anesthesia Type: General Level of consciousness: awake and alert Pain management: pain level controlled Vital Signs Assessment: post-procedure vital signs reviewed and stable Respiratory status: spontaneous breathing, nonlabored ventilation, respiratory function stable and patient connected to nasal cannula oxygen Cardiovascular status: blood pressure returned to baseline and stable Postop Assessment: no apparent nausea or vomiting Anesthetic complications: no   No complications documented.  Last Vitals:  Vitals:   12/30/19 1430 12/30/19 1438  BP: 123/75 119/72  Pulse: 62 (!) 56  Resp: 12 12  Temp:    SpO2: 100% 100%    Last Pain:  Vitals:   12/30/19 1438  TempSrc:   PainSc: 0-No pain                 [name]

## 2019-12-30 NOTE — Transfer of Care (Signed)
Immediate Anesthesia Transfer of Care Note  Patient: Amanda Koch  Procedure(s) Performed: LEFT BREAST LUMPECTOMY WITH RADIOACTIVE SEED LOCALIZATION (Left Breast)  Patient Location: PACU  Anesthesia Type:General  Level of Consciousness: awake, alert  and oriented  Airway & Oxygen Therapy: Patient Spontanous Breathing and Patient connected to face mask oxygen  Post-op Assessment: Report given to RN and Post -op Vital signs reviewed and stable  Post vital signs: Reviewed and stable  Last Vitals:  Vitals Value Taken Time  BP    Temp    Pulse 68 12/30/19 1420  Resp 17 12/30/19 1420  SpO2 100 % 12/30/19 1420  Vitals shown include unvalidated device data.  Last Pain:  Vitals:   12/30/19 1227  TempSrc: Oral  PainSc: 0-No pain         Complications: No complications documented.

## 2019-12-30 NOTE — Anesthesia Preprocedure Evaluation (Addendum)
Anesthesia Evaluation  Patient identified by MRN, date of birth, ID band Patient awake    Reviewed: Allergy & Precautions, H&P , NPO status , Patient's Chart, lab work & pertinent test results, reviewed documented beta blocker date and time   History of Anesthesia Complications Negative for: history of anesthetic complications  Airway Mallampati: I  TM Distance: >3 FB Neck ROM: full    Dental  (+) Teeth Intact   Pulmonary neg pulmonary ROS,    Pulmonary exam normal breath sounds clear to auscultation       Cardiovascular Exercise Tolerance: Good  Rhythm:regular Rate:Normal     Neuro/Psych PSYCHIATRIC DISORDERS Depression negative neurological ROS     GI/Hepatic negative GI ROS, Neg liver ROS,   Endo/Other  Hypothyroidism Morbid obesityBMI 34.8  Renal/GU negative Renal ROS  Female GU complaint     Musculoskeletal   Abdominal (+) + obese,   Peds  Hematology negative hematology ROS (+)   Anesthesia Other Findings   Reproductive/Obstetrics negative OB ROS                             Anesthesia Physical  Anesthesia Plan  ASA: II  Anesthesia Plan: General   Post-op Pain Management:    Induction: Intravenous  PONV Risk Score and Plan:   Airway Management Planned: LMA and Oral ETT  Additional Equipment: None  Intra-op Plan:   Post-operative Plan:   Informed Consent: I have reviewed the patients History and Physical, chart, labs and discussed the procedure including the risks, benefits and alternatives for the proposed anesthesia with the patient or authorized representative who has indicated his/her understanding and acceptance.     Dental Advisory Given  Plan Discussed with: CRNA, Surgeon and Anesthesiologist  Anesthesia Plan Comments: ( )        Anesthesia Quick Evaluation

## 2019-12-31 ENCOUNTER — Encounter (HOSPITAL_BASED_OUTPATIENT_CLINIC_OR_DEPARTMENT_OTHER): Payer: Self-pay | Admitting: Surgery

## 2020-01-01 LAB — SURGICAL PATHOLOGY

## 2020-01-27 ENCOUNTER — Other Ambulatory Visit: Payer: Self-pay | Admitting: Obstetrics and Gynecology

## 2020-01-27 DIAGNOSIS — Z9189 Other specified personal risk factors, not elsewhere classified: Secondary | ICD-10-CM

## 2020-06-19 ENCOUNTER — Other Ambulatory Visit: Payer: Self-pay

## 2020-06-19 ENCOUNTER — Ambulatory Visit
Admission: RE | Admit: 2020-06-19 | Discharge: 2020-06-19 | Disposition: A | Discharge status: Home / Self Care | Payer: Managed Care, Other (non HMO) | Source: Ambulatory Visit | Attending: Obstetrics and Gynecology | Admitting: Obstetrics and Gynecology

## 2020-06-19 DIAGNOSIS — Z9189 Other specified personal risk factors, not elsewhere classified: Secondary | ICD-10-CM

## 2020-06-19 MED ORDER — GADOBUTROL 1 MMOL/ML IV SOLN
10.0000 mL | Freq: Once | INTRAVENOUS | Status: AC | PRN
  Administered 2020-06-19: 10 mL via INTRAVENOUS

## 2020-12-29 HISTORY — PX: BREAST EXCISIONAL BIOPSY: SUR124

## 2021-01-30 ENCOUNTER — Other Ambulatory Visit: Payer: Self-pay | Admitting: Family Medicine

## 2021-01-30 ENCOUNTER — Ambulatory Visit
Admission: RE | Admit: 2021-01-30 | Discharge: 2021-01-30 | Disposition: A | Discharge status: Home / Self Care | Payer: 59 | Source: Ambulatory Visit | Attending: Family Medicine | Admitting: Family Medicine

## 2021-01-30 ENCOUNTER — Other Ambulatory Visit: Payer: Self-pay

## 2021-01-30 DIAGNOSIS — Z1231 Encounter for screening mammogram for malignant neoplasm of breast: Secondary | ICD-10-CM

## 2021-04-27 ENCOUNTER — Encounter (HOSPITAL_COMMUNITY): Payer: Self-pay

## 2022-02-06 ENCOUNTER — Other Ambulatory Visit: Payer: Self-pay | Admitting: Obstetrics and Gynecology

## 2022-02-06 DIAGNOSIS — Z9189 Other specified personal risk factors, not elsewhere classified: Secondary | ICD-10-CM

## 2022-02-22 ENCOUNTER — Ambulatory Visit (INDEPENDENT_AMBULATORY_CARE_PROVIDER_SITE_OTHER): Payer: 59

## 2022-02-22 ENCOUNTER — Ambulatory Visit (INDEPENDENT_AMBULATORY_CARE_PROVIDER_SITE_OTHER): Payer: 59 | Admitting: Podiatrist

## 2022-02-22 ENCOUNTER — Encounter: Payer: Self-pay | Admitting: Podiatrist

## 2022-02-22 DIAGNOSIS — M722 Plantar fascial fibromatosis: Secondary | ICD-10-CM

## 2022-02-22 MED ORDER — TRIAMCINOLONE ACETONIDE 10 MG/ML IJ SUSP
10.0000 mg | Freq: Once | INTRAMUSCULAR | Status: AC
  Administered 2022-02-22: 10 mg

## 2022-02-22 NOTE — Progress Notes (Signed)
Subjective: Amanda Koch is a 56 y.o. female patient presents to office with complaint of moderate heel pain on the right heel. Patient admits to post static dyskinesia for several weeks in duration.  She has had episodes of plantar fasciitis in the past and has always responded well to a steroid injection.  Patient has treated this problem with new [employer] running shoes, stretching, ice with minimal relief. Denies any other pedal complaints.   Patient Active Problem List   Diagnosis Date Noted   Fibroids 10/27/2013    Current Outpatient Medications on File Prior to Visit  Medication Sig Dispense Refill   HYDROcodone-acetaminophen (NORCO/VICODIN) 5-325 MG tablet Take 1 tablet by mouth every 6 (six) hours as needed for moderate pain. 15 tablet 0   levothyroxine (SYNTHROID, LEVOTHROID) 88 MCG tablet Take 88 mcg by mouth daily.     No current facility-administered medications on file prior to visit.    No Known Allergies  Objective: Physical Exam General: The patient is alert and oriented x3 in no acute distress.  Dermatology: Skin is warm, dry and supple bilateral lower extremities. Nails 1-10 are normal. There is no erythema, edema, no eccymosis, no open lesions present. Integument is otherwise unremarkable.  Vascular: Dorsalis Pedis pulse and Posterior Tibial pulse are 2/4 bilateral. Capillary fill time is immediate to all digits.  Neurological: Grossly intact to light touch with an achilles reflex of +2/5 and a  negative Tinel's sign bilateral.  Musculoskeletal: Tenderness to palpation at the medial calcaneal tubercale and through the insertion of the plantar fascia on the Right foot. No pain with compression of calcaneus bilateral. No pain with tuning fork to calcaneus bilateral. No pain with calf compression bilateral. There is decreased Ankle joint range of motion bilateral. All other joints range of motion within normal limits bilateral. Strength 5/5 in all groups bilateral.    Xray, rightfoot:  Normal osseous mineralization. Joint spaces preserved. No fracture/dislocation/boney destruction.  Small calcaneal spur present with mild thickening of plantar fascia. No other soft tissue abnormalities or radiopaque foreign bodies.   Assessment and Plan: Problem List Items Addressed This Visit   None Visit Diagnoses     Plantar fascial fibromatosis    -  Primary   Relevant Orders   DG Foot Complete Right       -Complete examination performed.  -Xrays reviewed - Discussed etiology and pathology of plantar fasciitis along with conservative approach to treatment.   -After oral consent and aseptic prep, injected a mixture containing '10mg'$  Kenalog and '5mg'$  marcaine plain was infiltrated into the area of maximal tenderness of the Right heel. Post-injection care discussed with patient.  - heel lifts -Recommended good supportive shoes and advised use of OTC insert.  -Explained and dispensed to patient daily stretching exercises.. -Patient to return to office as needed for follow-up.  She will call if symptoms fail to resolve in 3 to 4 weeks.

## 2022-03-01 ENCOUNTER — Other Ambulatory Visit: Payer: Managed Care, Other (non HMO)

## 2022-03-20 ENCOUNTER — Telehealth: Payer: Self-pay | Admitting: *Deleted

## 2022-03-20 NOTE — Telephone Encounter (Signed)
Patient is calling because after receiving an injection on the 7th, foot has not improved , is taking acetaminophen which is not helping.Please advise.

## 2022-03-20 NOTE — Telephone Encounter (Signed)
Patient has been updated thru voice message of physician's recommendations.

## 2022-04-12 ENCOUNTER — Other Ambulatory Visit: Payer: Self-pay | Admitting: Obstetrics and Gynecology

## 2022-04-12 DIAGNOSIS — Z9189 Other specified personal risk factors, not elsewhere classified: Secondary | ICD-10-CM

## 2022-04-16 ENCOUNTER — Encounter: Payer: Self-pay | Admitting: Obstetrics and Gynecology

## 2022-04-16 ENCOUNTER — Ambulatory Visit
Admission: RE | Admit: 2022-04-16 | Discharge: 2022-04-16 | Disposition: A | Discharge status: Home / Self Care | Payer: 59 | Source: Ambulatory Visit | Attending: Obstetrics and Gynecology | Admitting: Obstetrics and Gynecology

## 2022-04-16 DIAGNOSIS — Z9189 Other specified personal risk factors, not elsewhere classified: Secondary | ICD-10-CM

## 2022-04-16 MED ORDER — GADOPICLENOL 0.5 MMOL/ML IV SOLN
10.0000 mL | Freq: Once | INTRAVENOUS | Status: AC | PRN
  Administered 2022-04-16: 10 mL via INTRAVENOUS

## 2022-04-17 ENCOUNTER — Other Ambulatory Visit: Payer: Self-pay | Admitting: Obstetrics and Gynecology

## 2022-04-17 DIAGNOSIS — R928 Other abnormal and inconclusive findings on diagnostic imaging of breast: Secondary | ICD-10-CM

## 2022-04-26 ENCOUNTER — Other Ambulatory Visit: Payer: Self-pay | Admitting: Obstetrics and Gynecology

## 2022-04-26 ENCOUNTER — Ambulatory Visit
Admission: RE | Admit: 2022-04-26 | Discharge: 2022-04-26 | Disposition: A | Discharge status: Home / Self Care | Payer: 59 | Source: Ambulatory Visit | Attending: Obstetrics and Gynecology | Admitting: Obstetrics and Gynecology

## 2022-04-26 ENCOUNTER — Ambulatory Visit: Payer: 59

## 2022-04-26 DIAGNOSIS — R928 Other abnormal and inconclusive findings on diagnostic imaging of breast: Secondary | ICD-10-CM

## 2022-04-26 MED ORDER — GADOPICLENOL 0.5 MMOL/ML IV SOLN
10.0000 mL | Freq: Once | INTRAVENOUS | Status: AC | PRN
  Administered 2022-04-26: 10 mL via INTRAVENOUS

## 2022-10-26 ENCOUNTER — Ambulatory Visit
Admission: RE | Admit: 2022-10-26 | Discharge: 2022-10-26 | Disposition: A | Discharge status: Home / Self Care | Payer: 59 | Source: Ambulatory Visit | Attending: Obstetrics and Gynecology | Admitting: Obstetrics and Gynecology

## 2022-10-26 DIAGNOSIS — Z9189 Other specified personal risk factors, not elsewhere classified: Secondary | ICD-10-CM

## 2022-10-26 MED ORDER — GADOPICLENOL 0.5 MMOL/ML IV SOLN
10.0000 mL | Freq: Once | INTRAVENOUS | Status: AC | PRN
  Administered 2022-10-26: 10 mL via INTRAVENOUS
